# Patient Record
Sex: Female | Born: 1980 | Race: White | Hispanic: No | Smoking: Current every day smoker
Health system: Southern US, Community
[De-identification: ages and names within clinical notes are randomized; demographics above are authoritative.]

## PROBLEM LIST (undated history)

## (undated) ENCOUNTER — Inpatient Hospital Stay (HOSPITAL_COMMUNITY): Payer: Self-pay

## (undated) ENCOUNTER — Inpatient Hospital Stay (HOSPITAL_COMMUNITY): Payer: BC Managed Care – PPO

## (undated) DIAGNOSIS — F17200 Nicotine dependence, unspecified, uncomplicated: Secondary | ICD-10-CM

## (undated) DIAGNOSIS — R4586 Emotional lability: Secondary | ICD-10-CM

## (undated) DIAGNOSIS — O09299 Supervision of pregnancy with other poor reproductive or obstetric history, unspecified trimester: Secondary | ICD-10-CM

## (undated) DIAGNOSIS — Z789 Other specified health status: Secondary | ICD-10-CM

## (undated) DIAGNOSIS — F32A Depression, unspecified: Secondary | ICD-10-CM

## (undated) DIAGNOSIS — F329 Major depressive disorder, single episode, unspecified: Secondary | ICD-10-CM

## (undated) DIAGNOSIS — M545 Low back pain, unspecified: Secondary | ICD-10-CM

## (undated) DIAGNOSIS — Z8051 Family history of malignant neoplasm of kidney: Secondary | ICD-10-CM

## (undated) DIAGNOSIS — IMO0002 Reserved for concepts with insufficient information to code with codable children: Secondary | ICD-10-CM

## (undated) DIAGNOSIS — Z8041 Family history of malignant neoplasm of ovary: Secondary | ICD-10-CM

## (undated) HISTORY — PX: DILATION AND CURETTAGE OF UTERUS: SHX78

## (undated) HISTORY — DX: Supervision of pregnancy with other poor reproductive or obstetric history, unspecified trimester: O09.299

## (undated) HISTORY — DX: Low back pain, unspecified: M54.50

## (undated) HISTORY — DX: Low back pain: M54.5

## (undated) HISTORY — DX: Nicotine dependence, unspecified, uncomplicated: F17.200

## (undated) HISTORY — PX: WISDOM TOOTH EXTRACTION: SHX21

## (undated) HISTORY — DX: Family history of malignant neoplasm of ovary: Z80.41

## (undated) HISTORY — DX: Reserved for concepts with insufficient information to code with codable children: IMO0002

## (undated) HISTORY — DX: Family history of malignant neoplasm of kidney: Z80.51

## (undated) HISTORY — DX: Emotional lability: R45.86

---

## 2005-10-26 ENCOUNTER — Inpatient Hospital Stay (HOSPITAL_COMMUNITY): Admission: AD | Admit: 2005-10-26 | Discharge: 2005-10-28 | Payer: Self-pay | Admitting: Obstetrics and Gynecology

## 2010-10-28 LAB — HEPATITIS B SURFACE ANTIGEN: Hepatitis B Surface Ag: NEGATIVE

## 2010-10-28 LAB — ABO/RH: RH Type: POSITIVE

## 2010-10-28 LAB — ANTIBODY SCREEN: Antibody Screen: NEGATIVE

## 2011-02-17 NOTE — L&D Delivery Note (Signed)
Delivery Note At 3:00 PM a viable and healthy female was delivered via Vaginal, Spontaneous Delivery (Presentation:ROA ).  APGAR: 8,9 ; weight pending .   Placenta status: spontaneous and intact with 3 vessel  Cord:  with the following complications: none .  Cord pH: na  Anesthesia: Epidural  Episiotomy: None Lacerations: 1st degree Suture Repair: 2.0 vicryl rapide Est. Blood Loss (mL): 300  Mom to postpartum.  Baby to nursery-stable.  [name] J 05/19/2011, 3:11 PM

## 2011-03-05 LAB — RPR: RPR: NONREACTIVE

## 2011-03-24 ENCOUNTER — Encounter (HOSPITAL_COMMUNITY): Payer: Self-pay | Admitting: *Deleted

## 2011-03-24 ENCOUNTER — Observation Stay (HOSPITAL_COMMUNITY)
Admission: AD | Admit: 2011-03-24 | Discharge: 2011-03-25 | Disposition: A | Discharge status: Hospice | Payer: BC Managed Care – PPO | Source: Ambulatory Visit | Attending: Obstetrics and Gynecology | Admitting: Obstetrics and Gynecology

## 2011-03-24 DIAGNOSIS — O4290 Premature rupture of membranes, unspecified as to length of time between rupture and onset of labor, unspecified weeks of gestation: Principal | ICD-10-CM | POA: Insufficient documentation

## 2011-03-24 HISTORY — DX: Other specified health status: Z78.9

## 2011-03-24 HISTORY — DX: Depression, unspecified: F32.A

## 2011-03-24 HISTORY — DX: Major depressive disorder, single episode, unspecified: F32.9

## 2011-03-24 NOTE — Progress Notes (Signed)
Pt states she feels she had ROM at 2200. Pt states she was laying in bed.Pt states her back was hurting and removed heating pad and noticed that her underwear and sheets were wet

## 2011-03-25 ENCOUNTER — Inpatient Hospital Stay (HOSPITAL_COMMUNITY): Payer: BC Managed Care – PPO

## 2011-03-25 ENCOUNTER — Encounter (HOSPITAL_COMMUNITY): Payer: Self-pay | Admitting: Family Medicine

## 2011-03-25 NOTE — H&P (Signed)
[name], [name]               ACCOUNT NO.:  [id number]  MEDICAL RECORD NO.:  [id number]  LOCATION:  9176                          FACILITY:  [facility]  PHYSICIAN:  Lenoard Aden, M.D.DATE OF BIRTH:  August 31, 1980  DATE OF ADMISSION:  03/24/2011 DATE OF DISCHARGE:                             HISTORY & PHYSICAL   CHIEF COMPLAINT:  Questionable rupture of membranes.  HISTORY OF PRESENT ILLNESS:  A 31 year old white female, G5, P3 at 30 weeks and 2/7 days with questionable leakage of fluid this evening.  She has a history of 1st and 2nd trimester bleeding, which resolved and otherwise uncomplicated prenatal course.  She reports good fetal movement.  Denies contractions, had an episode of leakage of fluid this evening.  She has no known drug allergies.  MEDICATIONS:  Prenatal vitamins, Celexa, Wellbutrin, Vicodin as needed for pain.  FAMILY HISTORY:  Kidney cancer, heart disease.  PERSONAL HISTORY:  Noncontributory.  PREGNANCY HISTORY:  Remarkable for 3 vaginal deliveries and 2 spontaneous pregnancy loss.  Prenatal course complicated as noted above for bleeding of subchorionic hemorrhage lasting in the early 2nd trimester, subsequently resolved with normal followup ultrasound.  PHYSICAL EXAMINATION:  GENERAL:  A well-developed, well-nourished, white female, in no acute distress. HEENT:  Normal. NECK:  Supple.  Full range of motion. LUNGS:  Clear. HEART:  Regular rhythm. ABDOMEN:  Soft, gravid, nontender. VITAL SIGNS:  Blood pressure 117/59, pulse of 96. PELVIC:  Negative fern.  Negative Nitrazine.  Cervix is closed and long. EXTREMITIES:  There are no cords. NEUROLOGIC:  Nonfocal. SKIN:  Intact.  AFI initially done at 16.  Repeat AFI pending this morning.  IMPRESSION:  Thirty-week intrauterine pregnancy with questionable leakage of fluid, doubt rupture of membranes.  PLAN:  To repeat AFI.  Discharge to home, pending results.     Lenoard Aden,  M.D.     RJT/MEDQ  D:  03/25/2011  T:  03/25/2011  Job:  [id number]

## 2011-03-25 NOTE — Progress Notes (Signed)
[name] is a 31 y.o. [id number] at [redacted]w[redacted]d by LMP admitted for PROM ? Last night. No fluid leakage this am. Nl AFI.  Subjective: No further leakage  Objective: BP 117/59  Pulse 96  Temp(Src) 98.3 F (36.8 C) (Oral)  Resp 18  Ht 5\' 3"  (1.6 m)  Wt 90.266 kg (199 lb)  BMI 35.25 kg/m2      FHT:  FHR: 155 bpm, variability: moderate,  accelerations:  Present,  decelerations:  Absent UC:   none SVE:    closed/ long/ OOP No fluid , no pooling  Labs: Nl AFI on sono No results found for this basename: WBC, HGB, HCT, MCV, PLT    Assessment / Plan: 30 week IUP with r/o SROM. Nl AFI, no F/N on exam  Labor: na Preeclampsia:  na Fetal Wellbeing:  Category I Pain Control:  na I/D:  n/a Anticipated MOD:  DC home  Dariane Natzke J 03/25/2011, 8:57 AM

## 2011-03-25 NOTE — Progress Notes (Signed)
Pt returned to [facility].Pt would like an update of plan of care as soon as possible.

## 2011-03-25 NOTE — Progress Notes (Signed)
Dr. Billy Coast in to examine pt.  No cervical change.  D/C orders given to pt per Dr. Billy Coast and pt states understanding.  Pt. With no complaints.

## 2011-03-25 NOTE — Progress Notes (Signed)
Rosemae Mcquown is a 31 y.o. [id number] at [redacted]w[redacted]d by LMP admitted for PROM with ? LOF last night. Good FM.   Subjective: No complaints   Objective: BP 117/59  Pulse 96  Temp(Src) 98.3 F (36.8 C) (Oral)  Resp 18  Ht 5\' 3"  (1.6 m)  Wt 90.266 kg (199 lb)  BMI 35.25 kg/m2      FHT:  FHR: 140 bpm, variability: moderate,  accelerations:  Present,  decelerations:  Absent UC:   none SVE:    Neg fern and nitrazine  Labs: No results found for this basename: WBC, HGB, HCT, MCV, PLT  Sono c/w nl AFI 16  Assessment / Plan: ? PROM at 30 weeks  Labor: rpt AFI this am Preeclampsia:  na Fetal Wellbeing:  Category I Pain Control:  na I/D:  n/a Anticipated MOD:  na  Torres Hardenbrook J 03/25/2011, 7:58 AM

## 2011-03-27 NOTE — Progress Notes (Signed)
Post discharge chart review completed.  

## 2011-04-30 LAB — STREP B DNA PROBE: GBS: NEGATIVE

## 2011-05-08 ENCOUNTER — Telehealth (HOSPITAL_COMMUNITY): Payer: Self-pay | Admitting: *Deleted

## 2011-05-08 ENCOUNTER — Encounter (HOSPITAL_COMMUNITY): Payer: Self-pay | Admitting: *Deleted

## 2011-05-08 NOTE — Telephone Encounter (Signed)
Preadmission screen  

## 2011-05-14 ENCOUNTER — Other Ambulatory Visit: Payer: Self-pay | Admitting: Obstetrics and Gynecology

## 2011-05-19 ENCOUNTER — Inpatient Hospital Stay (HOSPITAL_COMMUNITY)
Admission: RE | Admit: 2011-05-19 | Discharge: 2011-05-20 | DRG: 372 | Disposition: A | Discharge status: Home / Self Care | Payer: BC Managed Care – PPO | Source: Ambulatory Visit | Attending: Obstetrics and Gynecology | Admitting: Obstetrics and Gynecology

## 2011-05-19 ENCOUNTER — Encounter (HOSPITAL_COMMUNITY): Payer: Self-pay

## 2011-05-19 ENCOUNTER — Encounter (HOSPITAL_COMMUNITY): Payer: Self-pay | Admitting: Anesthesiology

## 2011-05-19 ENCOUNTER — Inpatient Hospital Stay (HOSPITAL_COMMUNITY): Payer: BC Managed Care – PPO | Admitting: Anesthesiology

## 2011-05-19 DIAGNOSIS — O4590 Premature separation of placenta, unspecified, unspecified trimester: Principal | ICD-10-CM | POA: Diagnosis present

## 2011-05-19 LAB — CBC
HCT: 37.4 % (ref 36.0–46.0)
MCHC: 33.7 g/dL (ref 30.0–36.0)
Platelets: 185 10*3/uL (ref 150–400)
RDW: 14.2 % (ref 11.5–15.5)
WBC: 15.4 10*3/uL — ABNORMAL HIGH (ref 4.0–10.5)

## 2011-05-19 MED ORDER — LACTATED RINGERS IV SOLN
500.0000 mL | INTRAVENOUS | Status: DC | PRN
Start: 2011-05-19 — End: 2011-05-19

## 2011-05-19 MED ORDER — DIPHENHYDRAMINE HCL 25 MG PO CAPS: 25.0000 mg | ORAL_CAPSULE | Freq: Four times a day (QID) | ORAL | Status: DC | PRN

## 2011-05-19 MED ORDER — EPHEDRINE 5 MG/ML INJ
10.0000 mg | INTRAVENOUS | Status: DC | PRN
  Filled 2011-05-19 (×2): qty 4

## 2011-05-19 MED ORDER — OXYTOCIN 20 UNITS IN LACTATED RINGERS INFUSION - SIMPLE
1.0000 m[IU]/min | INTRAVENOUS | Status: DC
  Administered 2011-05-19: 2 m[IU]/min via INTRAVENOUS
  Filled 2011-05-19: qty 1000

## 2011-05-19 MED ORDER — FENTANYL 2.5 MCG/ML BUPIVACAINE 1/10 % EPIDURAL INFUSION (WH - ANES)
14.0000 mL/h | INTRAMUSCULAR | Status: DC
  Administered 2011-05-19 (×2): 14 mL/h via EPIDURAL
  Filled 2011-05-19 (×3): qty 60

## 2011-05-19 MED ORDER — OXYCODONE-ACETAMINOPHEN 5-325 MG PO TABS: 1.0000 | ORAL_TABLET | ORAL | Status: DC | PRN

## 2011-05-19 MED ORDER — FLEET ENEMA 7-19 GM/118ML RE ENEM: 1.0000 | ENEMA | RECTAL | Status: DC | PRN

## 2011-05-19 MED ORDER — BENZOCAINE-MENTHOL 20-0.5 % EX AERO
INHALATION_SPRAY | CUTANEOUS | Status: AC
  Administered 2011-05-19: 1 via TOPICAL
  Filled 2011-05-19: qty 56

## 2011-05-19 MED ORDER — SENNOSIDES-DOCUSATE SODIUM 8.6-50 MG PO TABS
2.0000 | ORAL_TABLET | Freq: Every day | ORAL | Status: DC
  Administered 2011-05-19: 2 via ORAL

## 2011-05-19 MED ORDER — METHYLERGONOVINE MALEATE 0.2 MG/ML IJ SOLN: 0.2000 mg | INTRAMUSCULAR | Status: DC | PRN

## 2011-05-19 MED ORDER — BENZOCAINE-MENTHOL 20-0.5 % EX AERO
1.0000 "application " | INHALATION_SPRAY | CUTANEOUS | Status: DC | PRN
  Administered 2011-05-19: 1 via TOPICAL

## 2011-05-19 MED ORDER — ONDANSETRON HCL 4 MG PO TABS: 4.0000 mg | ORAL_TABLET | ORAL | Status: DC | PRN

## 2011-05-19 MED ORDER — ACETAMINOPHEN 325 MG PO TABS: 650.0000 mg | ORAL_TABLET | ORAL | Status: DC | PRN

## 2011-05-19 MED ORDER — DIPHENHYDRAMINE HCL 50 MG/ML IJ SOLN: 12.5000 mg | INTRAMUSCULAR | Status: DC | PRN

## 2011-05-19 MED ORDER — METHYLERGONOVINE MALEATE 0.2 MG PO TABS: 0.2000 mg | ORAL_TABLET | ORAL | Status: DC | PRN

## 2011-05-19 MED ORDER — PHENYLEPHRINE 40 MCG/ML (10ML) SYRINGE FOR IV PUSH (FOR BLOOD PRESSURE SUPPORT)
80.0000 ug | PREFILLED_SYRINGE | INTRAVENOUS | Status: DC | PRN
  Filled 2011-05-19: qty 5

## 2011-05-19 MED ORDER — LANOLIN HYDROUS EX OINT: TOPICAL_OINTMENT | CUTANEOUS | Status: DC | PRN

## 2011-05-19 MED ORDER — DIBUCAINE 1 % RE OINT: 1.0000 "application " | TOPICAL_OINTMENT | RECTAL | Status: DC | PRN

## 2011-05-19 MED ORDER — WITCH HAZEL-GLYCERIN EX PADS: 1.0000 "application " | MEDICATED_PAD | CUTANEOUS | Status: DC | PRN

## 2011-05-19 MED ORDER — CITRIC ACID-SODIUM CITRATE 334-500 MG/5ML PO SOLN: 30.0000 mL | ORAL | Status: DC | PRN

## 2011-05-19 MED ORDER — LACTATED RINGERS IV SOLN: INTRAVENOUS | Status: DC

## 2011-05-19 MED ORDER — LIDOCAINE HCL (PF) 1 % IJ SOLN
INTRAMUSCULAR | Status: DC | PRN
  Administered 2011-05-19 (×2): 5 mL

## 2011-05-19 MED ORDER — SIMETHICONE 80 MG PO CHEW: 80.0000 mg | CHEWABLE_TABLET | ORAL | Status: DC | PRN

## 2011-05-19 MED ORDER — LACTATED RINGERS IV SOLN
500.0000 mL | Freq: Once | INTRAVENOUS | Status: AC
  Administered 2011-05-19: 1000 mL via INTRAVENOUS

## 2011-05-19 MED ORDER — TERBUTALINE SULFATE 1 MG/ML IJ SOLN: 0.2500 mg | Freq: Once | INTRAMUSCULAR | Status: DC | PRN

## 2011-05-19 MED ORDER — LIDOCAINE HCL (PF) 1 % IJ SOLN: 30.0000 mL | INTRAMUSCULAR | Status: DC | PRN

## 2011-05-19 MED ORDER — ONDANSETRON HCL 4 MG/2ML IJ SOLN: 4.0000 mg | INTRAMUSCULAR | Status: DC | PRN

## 2011-05-19 MED ORDER — EPHEDRINE 5 MG/ML INJ: 10.0000 mg | INTRAVENOUS | Status: DC | PRN

## 2011-05-19 MED ORDER — CITALOPRAM HYDROBROMIDE 20 MG PO TABS
20.0000 mg | ORAL_TABLET | Freq: Every day | ORAL | Status: DC
  Administered 2011-05-20: 20 mg via ORAL
  Filled 2011-05-19 (×3): qty 1

## 2011-05-19 MED ORDER — OXYTOCIN 20 UNITS IN LACTATED RINGERS INFUSION - SIMPLE: 125.0000 mL/h | INTRAVENOUS | Status: DC

## 2011-05-19 MED ORDER — PRENATAL MULTIVITAMIN CH
1.0000 | ORAL_TABLET | Freq: Every day | ORAL | Status: DC
  Administered 2011-05-19 – 2011-05-20 (×2): 1 via ORAL
  Filled 2011-05-19 (×2): qty 1

## 2011-05-19 MED ORDER — OXYTOCIN BOLUS FROM INFUSION
500.0000 mL | Freq: Once | INTRAVENOUS | Status: DC
  Filled 2011-05-19: qty 500

## 2011-05-19 MED ORDER — ZOLPIDEM TARTRATE 5 MG PO TABS: 5.0000 mg | ORAL_TABLET | Freq: Every evening | ORAL | Status: DC | PRN

## 2011-05-19 MED ORDER — IBUPROFEN 600 MG PO TABS
600.0000 mg | ORAL_TABLET | Freq: Four times a day (QID) | ORAL | Status: DC
  Administered 2011-05-19 – 2011-05-20 (×4): 600 mg via ORAL
  Filled 2011-05-19 (×3): qty 1

## 2011-05-19 MED ORDER — TETANUS-DIPHTH-ACELL PERTUSSIS 5-2.5-18.5 LF-MCG/0.5 IM SUSP
0.5000 mL | Freq: Once | INTRAMUSCULAR | Status: AC
  Administered 2011-05-20: 0.5 mL via INTRAMUSCULAR
  Filled 2011-05-19: qty 0.5

## 2011-05-19 MED ORDER — OXYTOCIN 20 UNITS IN LACTATED RINGERS INFUSION - SIMPLE: 125.0000 mL/h | Freq: Once | INTRAVENOUS | Status: DC

## 2011-05-19 MED ORDER — ONDANSETRON HCL 4 MG/2ML IJ SOLN: 4.0000 mg | Freq: Four times a day (QID) | INTRAMUSCULAR | Status: DC | PRN

## 2011-05-19 MED ORDER — IBUPROFEN 600 MG PO TABS: 600.0000 mg | ORAL_TABLET | Freq: Four times a day (QID) | ORAL | Status: DC | PRN

## 2011-05-19 NOTE — Progress Notes (Signed)
[name] is a 31 y.o. [id number] at [redacted]w[redacted]d by ultrasound admitted for induction of labor due to unexplained third trimester bleeding.  Subjective: comfortable   Objective: BP 119/71  Pulse 86  Temp(Src) 98.2 F (36.8 C) (Oral)  Resp 18  Ht 5\' 3"  (1.6 m)  Wt 90.719 kg (200 lb)  BMI 35.43 kg/m2  SpO2 99%      FHT:  FHR: 155 bpm, variability: moderate,  accelerations:  Present,  decelerations:  Absent UC:   regular, every 2-3 minutes SVE:   Dilation: 5 Effacement (%): 80 Station: -1 Exam by:: Deshawnda Acrey IUPC placed without difficulty  Labs: Lab Results  Component Value Date   WBC 15.4* 05/19/2011   HGB 12.6 05/19/2011   HCT 37.4 05/19/2011   MCV 92.8 05/19/2011   PLT 185 05/19/2011    Assessment / Plan: Induction of labor due to third trimester bleeding suspicious for chronic abruptio,  progressing well on pitocin  Labor: Progressing normally Preeclampsia:  na Fetal Wellbeing:  Category I Pain Control:  Epidural I/D:  n/a Anticipated MOD:  NSVD  Tasean Mancha J 05/19/2011, 1:59 PM

## 2011-05-19 NOTE — Anesthesia Preprocedure Evaluation (Signed)
Anesthesia Evaluation  Patient identified by MRN, date of birth, ID band Patient awake    Reviewed: Allergy & Precautions, H&P , Patient's Chart, lab work & pertinent test results  Airway Mallampati: II TM Distance: >3 FB Neck ROM: full    Dental No notable dental hx.    Pulmonary neg pulmonary ROS, Current Smoker,  breath sounds clear to auscultation  Pulmonary exam normal       Cardiovascular negative cardio ROS  Rhythm:regular Rate:Normal     Neuro/Psych negative neurological ROS  negative psych ROS   GI/Hepatic negative GI ROS, Neg liver ROS,   Endo/Other  negative endocrine ROS  Renal/GU negative Renal ROS     Musculoskeletal   Abdominal   Peds  Hematology negative hematology ROS (+)   Anesthesia Other Findings   Reproductive/Obstetrics (+) Pregnancy                           Anesthesia Physical Anesthesia Plan  ASA: II  Anesthesia Plan: Epidural   Post-op Pain Management:    Induction:   Airway Management Planned:   Additional Equipment:   Intra-op Plan:   Post-operative Plan:   Informed Consent: I have reviewed the patients History and Physical, chart, labs and discussed the procedure including the risks, benefits and alternatives for the proposed anesthesia with the patient or authorized representative who has indicated his/her understanding and acceptance.     Plan Discussed with:   Anesthesia Plan Comments:         Anesthesia Quick Evaluation

## 2011-05-19 NOTE — H&P (Signed)
[name], [name]               ACCOUNT NO.:  [id number]  MEDICAL RECORD NO.:  [id number]  LOCATION:  9170                          FACILITY:  [facility]  PHYSICIAN:  Lenoard Aden, M.D.DATE OF BIRTH:  05/11/1980  DATE OF ADMISSION:  05/19/2011 DATE OF DISCHARGE:                             HISTORY & PHYSICAL   CHIEF COMPLAINT:  History of unexplained third trimester bleeding, for induction at term.  HISTORY OF PRESENT ILLNESS:  She is a 31 year old white female, G5, P3, at 71 weeks' gestation, who presents for the aforementioned indications for induction.  She has a medication list to include Wellbutrin, Celexa, prenatal vitamins.  FAMILY HISTORY:  Cancer, heart disease, and kidney stones.  She has a history of 3 vaginal deliveries and 1 pregnancy loss.  She is a nonsmoker, nondrinker.  She denies domestic or physical violence.  MEDICAL HISTORY:  Remarkable for depression and anxiety, controlled on Celexa and Wellbutrin.  Her prenatal course has been complicated by unexplained bleeding in the second trimester which started at approximately 13-16 weeks with intermittent bleeding noted through the early third trimester.  No evidence of placenta previa.  No evidence of subchorionic hemorrhage in the third trimester, however, multiple episodes were noted suspicious for chronic abruption.  This case was discussed with Dr. Particia Nearing, MFM, who agreed with induction of labor at 39 weeks due to history of unexplained bleeding.  PHYSICAL EXAMINATION:  GENERAL:  She is a well-developed, well-nourished white female, in no acute distress. HEENT:  Normal. NECK:  Supple.  Full range of motion. LUNGS:  Clear. HEART:  Regular rhythm. ABDOMEN:  Soft, gravid, nontender. OB/GYN:  Estimated fetal weight 7 pounds.  Cervix 2-3 cm, 60%, vertex, - 1. EXTREMITIES:  Reveal no cords. NEUROLOGIC:  Nonfocal. SKIN:  Intact.  IMPRESSION:  A 39-week intrauterine pregnancy with a history  of unexplained third trimester bleeding suspicious for chronic abruption.  PLAN:  Will be to admit, administer Pitocin, epidural as needed, anticipate attempts at vaginal delivery.     Lenoard Aden, M.D.     RJT/MEDQ  D:  05/19/2011  T:  05/19/2011  Job:  [id number]

## 2011-05-19 NOTE — Progress Notes (Signed)
[name] is a 31 y.o. [id number] at [redacted]w[redacted]d by ultrasound admitted for induction of labor due to Unexplained third trimester bleeding.  Subjective: Comfortable No bleeding today  Objective: VSS- afebrile      FHT:  FHR: 155 bpm, variability: moderate,  accelerations:  Present,  decelerations:  Absent UC:   rare SVE:    2/60/-1  Labs: No results found for this basename: WBC, HGB, HCT, MCV, PLT    Assessment / Plan: Induction of labor due to unexplained third trimester bleeding, ? chronic abruptio,  progressing well on pitocin  Labor: Progressing on Pitocin, will continue to increase then AROM Preeclampsia:  na Fetal Wellbeing:  Category I Pain Control:  Labor support without medications I/D:  n/a Anticipated MOD:  NSVD  Prem Coykendall J 05/19/2011, 7:49 AM

## 2011-05-19 NOTE — Anesthesia Procedure Notes (Signed)
Epidural Patient location during procedure: OB Start time: 05/19/2011 9:28 AM  Staffing Anesthesiologist: Brayton Caves R Performed by: anesthesiologist   Preanesthetic Checklist Completed: patient identified, site marked, surgical consent, pre-op evaluation, timeout performed, IV checked, risks and benefits discussed and monitors and equipment checked  Epidural Patient position: sitting Prep: site prepped and draped and DuraPrep Patient monitoring: continuous pulse ox and blood pressure Approach: midline Injection technique: LOR air and LOR saline  Needle:  Needle type: Tuohy  Needle gauge: 17 G Needle length: 9 cm Needle insertion depth: 6 cm Catheter type: closed end flexible Catheter size: 19 Gauge Catheter at skin depth: 11 cm Test dose: negative  Assessment Events: blood not aspirated, injection not painful, no injection resistance, negative IV test and no paresthesia  Additional Notes Patient identified.  Risk benefits discussed including failed block, incomplete pain control, headache, nerve damage, paralysis, blood pressure changes, nausea, vomiting, reactions to medication both toxic or allergic, and postpartum back pain.  Patient expressed understanding and wished to proceed.  All questions were answered.  Sterile technique used throughout procedure and epidural site dressed with sterile barrier dressing. No paresthesia or other complications noted.The patient did not experience any signs of intravascular injection such as tinnitus or metallic taste in mouth nor signs of intrathecal spread such as rapid motor block. Please see nursing notes for vital signs.

## 2011-05-20 LAB — CBC
MCHC: 33.2 g/dL (ref 30.0–36.0)
Platelets: 172 10*3/uL (ref 150–400)
RDW: 14.2 % (ref 11.5–15.5)
WBC: 15.1 10*3/uL — ABNORMAL HIGH (ref 4.0–10.5)

## 2011-05-20 MED ORDER — IBUPROFEN 600 MG PO TABS: 600.0000 mg | ORAL_TABLET | Freq: Four times a day (QID) | ORAL | Status: AC

## 2011-05-20 NOTE — Anesthesia Postprocedure Evaluation (Signed)
  Anesthesia Post-op Note  Patient: [name]  Procedure(s) Performed: * No procedures listed *  Patient Location: Mother/Baby  Anesthesia Type: Epidural  Level of Consciousness: oriented  Airway and Oxygen Therapy: Patient Spontanous Breathing  Post-op Pain: mild  Post-op Assessment: Patient's Cardiovascular Status Stable and Respiratory Function Stable  Post-op Vital Signs: stable  Complications: No apparent anesthesia complications

## 2011-05-20 NOTE — Discharge Summary (Signed)
Obstetric Discharge Summary Reason for Admission: induction of labor secondary to unexplained third trimester bleeding Prenatal Procedures: ultrasound Intrapartum Procedures: spontaneous vaginal delivery Postpartum Procedures: none Complications-Operative and Postpartum: 2nd degree perineal laceration Hemoglobin  Date Value Range Status  05/20/2011 12.3  12.0-15.0 (g/dL) Final     HCT  Date Value Range Status  05/20/2011 37.1  36.0-46.0 (%) Final    Physical Exam:  General: alert, cooperative and no distress Lochia: appropriate Uterine Fundus: firm Incision: healing well DVT Evaluation: No evidence of DVT seen on physical exam.  Discharge Diagnoses: Term Pregnancy-delivered  Discharge Information: Date: 05/20/2011 Activity: pelvic rest Diet: routine Medications: Ibuprofen 600po q6hrs prn pain Condition: stable Instructions: refer to practice specific booklet Discharge to: home   Newborn Data: Live born female  Birth Weight: 6 lb 7 oz (2920 g) APGAR: 9, 10  Home with mother.  [name] 05/20/2011, 9:27 AM

## 2011-05-20 NOTE — Progress Notes (Signed)

## 2011-05-20 NOTE — Progress Notes (Signed)
Patient ID: [name], female   DOB: 1980/06/13, 31 y.o.   MRN: [id number] PPD # 2  Subjective: Pt reports feeling well/ Pain controlled with prescription NSAID's including ibuprofen Tolerating po/ Voiding without problems/ No n/v/no stool Bleeding is light/ Newborn info:  Information for the patient's newborn:  Valeria, Boza [[id number]]  female Feeding: breast    Objective:  VS: Blood pressure 106/72, pulse 77, temperature 98.2 F (36.8 C), temperature source Oral, resp. rate 20, height 5\' 3"  (1.6 m), weight 90.719 kg (200 lb), SpO2 98.00%, unknown if currently breastfeeding.    Basename 05/20/11 0535 05/19/11 0800  WBC 15.1* 15.4*  HGB 12.3 12.6  HCT 37.1 37.4  PLT 172 185    Blood type: A/Positive/-- (09/11 0000) Rubella: Immune (09/11 0000)    Physical Exam:  General: A & O x 3  alert, cooperative and no distress CV: Regular rate and rhythm Resp: clear Abdomen: soft, nontender, normal bowel sounds Uterine Fundus: firm, below umbilicus, nontender Perineum: no evidence of infection or separation Lochia: minimal Ext: extremities normal, atraumatic, no cyanosis or edema    A/P: PPD # 2/ G5P4014/ S/P:spontaneous vaginal delivery 05/19/10 Doing well and stable for discharge home RX: Ibuprofen 600mg  po Q 6 hrs prn pain #30 Refill x 1 WOB/GYN booklet given Routine pp visit in 6wks   [facility], [facility] 05/20/2011, 9:17 AM

## 2012-02-17 NOTE — L&D Delivery Note (Signed)
Delivery Note At 10:14 PM a viable and healthy female was delivered via Vaginal, Spontaneous Delivery (Presentation: left occiput; anterior ).  APGAR: 9, 9; weight pending.   Placenta status: Intact, Spontaneous.  Cord: 3 vessels   Anesthesia: Epidural  Episiotomy: None Lacerations: 1st degree Suture Repair: 3.0 vicryl Est. Blood Loss (mL):   Mom to postpartum.  Baby to nursery-stable.  [name] 11/04/2012, 10:34 PM

## 2012-08-02 LAB — OB RESULTS CONSOLE ANTIBODY SCREEN: Antibody Screen: NEGATIVE

## 2012-08-02 LAB — OB RESULTS CONSOLE GC/CHLAMYDIA
Chlamydia: NEGATIVE
Gonorrhea: NEGATIVE

## 2012-08-02 LAB — OB RESULTS CONSOLE ABO/RH

## 2012-10-06 LAB — OB RESULTS CONSOLE GBS: GBS: NEGATIVE

## 2012-10-28 ENCOUNTER — Encounter (HOSPITAL_COMMUNITY): Payer: Self-pay | Admitting: *Deleted

## 2012-10-28 ENCOUNTER — Inpatient Hospital Stay (HOSPITAL_COMMUNITY)
Admission: AD | Admit: 2012-10-28 | Discharge: 2012-10-28 | Disposition: A | Discharge status: Home / Self Care | Payer: Medicaid Other | Source: Ambulatory Visit | Attending: Obstetrics and Gynecology | Admitting: Obstetrics and Gynecology

## 2012-10-28 DIAGNOSIS — O479 False labor, unspecified: Secondary | ICD-10-CM | POA: Insufficient documentation

## 2012-10-28 NOTE — MAU Note (Signed)
I was checked at office today and was 4cm and thin. My contractions are closer and stronger since the exam at office

## 2012-10-28 NOTE — MAU Note (Signed)
Patient presents to MAU today with c/o contractions. Denies LOF nor bleeding. Reports good fetal movement.

## 2012-11-04 ENCOUNTER — Encounter (HOSPITAL_COMMUNITY): Payer: Self-pay | Admitting: *Deleted

## 2012-11-04 ENCOUNTER — Inpatient Hospital Stay (HOSPITAL_COMMUNITY)
Admission: AD | Admit: 2012-11-04 | Discharge: 2012-11-06 | DRG: 775 | Disposition: A | Discharge status: Home / Self Care | Payer: Medicaid Other | Source: Ambulatory Visit | Attending: Obstetrics and Gynecology | Admitting: Obstetrics and Gynecology

## 2012-11-04 ENCOUNTER — Encounter (HOSPITAL_COMMUNITY): Payer: Self-pay | Admitting: Anesthesiology

## 2012-11-04 ENCOUNTER — Inpatient Hospital Stay (HOSPITAL_COMMUNITY): Payer: Medicaid Other | Admitting: Anesthesiology

## 2012-11-04 DIAGNOSIS — O480 Post-term pregnancy: Principal | ICD-10-CM | POA: Diagnosis present

## 2012-11-04 DIAGNOSIS — O09529 Supervision of elderly multigravida, unspecified trimester: Secondary | ICD-10-CM | POA: Diagnosis present

## 2012-11-04 DIAGNOSIS — O99334 Smoking (tobacco) complicating childbirth: Secondary | ICD-10-CM | POA: Diagnosis present

## 2012-11-04 LAB — CBC
HCT: 38.2 % (ref 36.0–46.0)
Hemoglobin: 13.3 g/dL (ref 12.0–15.0)
MCH: 31.7 pg (ref 26.0–34.0)
MCV: 91 fL (ref 78.0–100.0)
RBC: 4.2 MIL/uL (ref 3.87–5.11)

## 2012-11-04 LAB — TYPE AND SCREEN

## 2012-11-04 LAB — RPR: RPR Ser Ql: NONREACTIVE

## 2012-11-04 MED ORDER — OXYTOCIN 40 UNITS IN LACTATED RINGERS INFUSION - SIMPLE MED
62.5000 mL/h | INTRAVENOUS | Status: DC
  Filled 2012-11-04: qty 1000

## 2012-11-04 MED ORDER — LACTATED RINGERS IV SOLN
INTRAVENOUS | Status: DC
  Administered 2012-11-04 (×2): via INTRAVENOUS

## 2012-11-04 MED ORDER — OXYTOCIN 40 UNITS IN LACTATED RINGERS INFUSION - SIMPLE MED
1.0000 m[IU]/min | INTRAVENOUS | Status: DC
  Administered 2012-11-04: 2 m[IU]/min via INTRAVENOUS

## 2012-11-04 MED ORDER — DIPHENHYDRAMINE HCL 50 MG/ML IJ SOLN: 12.5000 mg | INTRAMUSCULAR | Status: DC | PRN

## 2012-11-04 MED ORDER — EPHEDRINE 5 MG/ML INJ
10.0000 mg | INTRAVENOUS | Status: DC | PRN
  Filled 2012-11-04: qty 4
  Filled 2012-11-04: qty 2

## 2012-11-04 MED ORDER — ACETAMINOPHEN 325 MG PO TABS: 650.0000 mg | ORAL_TABLET | ORAL | Status: DC | PRN

## 2012-11-04 MED ORDER — OXYTOCIN BOLUS FROM INFUSION
500.0000 mL | INTRAVENOUS | Status: DC
  Administered 2012-11-04: 500 mL via INTRAVENOUS

## 2012-11-04 MED ORDER — ONDANSETRON HCL 4 MG/2ML IJ SOLN: 4.0000 mg | Freq: Four times a day (QID) | INTRAMUSCULAR | Status: DC | PRN

## 2012-11-04 MED ORDER — IBUPROFEN 600 MG PO TABS
600.0000 mg | ORAL_TABLET | Freq: Four times a day (QID) | ORAL | Status: DC | PRN
  Filled 2012-11-04: qty 1

## 2012-11-04 MED ORDER — TERBUTALINE SULFATE 1 MG/ML IJ SOLN: 0.2500 mg | Freq: Once | INTRAMUSCULAR | Status: AC | PRN

## 2012-11-04 MED ORDER — LIDOCAINE HCL (PF) 1 % IJ SOLN
30.0000 mL | INTRAMUSCULAR | Status: DC | PRN
  Filled 2012-11-04 (×2): qty 30

## 2012-11-04 MED ORDER — LACTATED RINGERS IV SOLN: 500.0000 mL | Freq: Once | INTRAVENOUS | Status: DC

## 2012-11-04 MED ORDER — FENTANYL 2.5 MCG/ML BUPIVACAINE 1/10 % EPIDURAL INFUSION (WH - ANES)
14.0000 mL/h | INTRAMUSCULAR | Status: DC | PRN
  Administered 2012-11-04: 14 mL/h via EPIDURAL
  Filled 2012-11-04: qty 125

## 2012-11-04 MED ORDER — OXYCODONE-ACETAMINOPHEN 5-325 MG PO TABS: 1.0000 | ORAL_TABLET | ORAL | Status: DC | PRN

## 2012-11-04 MED ORDER — ZOLPIDEM TARTRATE 5 MG PO TABS: 5.0000 mg | ORAL_TABLET | Freq: Every evening | ORAL | Status: DC | PRN

## 2012-11-04 MED ORDER — PHENYLEPHRINE 40 MCG/ML (10ML) SYRINGE FOR IV PUSH (FOR BLOOD PRESSURE SUPPORT)
80.0000 ug | PREFILLED_SYRINGE | INTRAVENOUS | Status: DC | PRN
  Filled 2012-11-04: qty 2

## 2012-11-04 MED ORDER — CITRIC ACID-SODIUM CITRATE 334-500 MG/5ML PO SOLN: 30.0000 mL | ORAL | Status: DC | PRN

## 2012-11-04 MED ORDER — PHENYLEPHRINE 40 MCG/ML (10ML) SYRINGE FOR IV PUSH (FOR BLOOD PRESSURE SUPPORT)
80.0000 ug | PREFILLED_SYRINGE | INTRAVENOUS | Status: DC | PRN
  Filled 2012-11-04: qty 2
  Filled 2012-11-04: qty 5

## 2012-11-04 MED ORDER — BUTORPHANOL TARTRATE 1 MG/ML IJ SOLN: 1.0000 mg | INTRAMUSCULAR | Status: DC | PRN

## 2012-11-04 MED ORDER — SODIUM BICARBONATE 8.4 % IV SOLN
INTRAVENOUS | Status: DC | PRN
  Administered 2012-11-04: 5 mL via EPIDURAL

## 2012-11-04 MED ORDER — LACTATED RINGERS IV SOLN: 500.0000 mL | INTRAVENOUS | Status: DC | PRN

## 2012-11-04 MED ORDER — EPHEDRINE 5 MG/ML INJ
10.0000 mg | INTRAVENOUS | Status: DC | PRN
  Filled 2012-11-04: qty 2

## 2012-11-04 NOTE — Anesthesia Preprocedure Evaluation (Addendum)

## 2012-11-04 NOTE — H&P (Addendum)
Pascha Fogal is a 32 y.o. female presenting for IOL for postterm pregnancy  32 G6P4014 @ 40+3 presenting for PD IOL. Pts pregnancy has been uncomplicated History OB History   Grav Para Term Preterm Abortions TAB SAB Ect Mult Living   6 4 4  0 1 0 1 0 0 4     Past Medical History  Diagnosis Date  . No pertinent past medical history   . Depression   . Pregnancy with other poor obstetric history(V23.49)   . Tobacco use disorder   . Emotional lability   . Lumbago   . Abnormal Pap smear    Past Surgical History  Procedure Laterality Date  . Dilation and curettage of uterus     Family History: family history includes Cancer in her paternal grandmother; Heart attack in her father; Heart disease in her father; Hyperlipidemia in her mother; Hypertension in her mother; Kidney disease in her father. Social History:  reports that she has been smoking Cigarettes.  She has been smoking about 0.50 packs per day. She has never used smokeless tobacco. She reports that she does not drink alcohol or use illicit drugs.   Prenatal Transfer Tool  Maternal Diabetes: No Genetic Screening: Normal Maternal Ultrasounds/Referrals: Normal Fetal Ultrasounds or other Referrals:  None Maternal Substance Abuse:  No Significant Maternal Medications:  None Significant Maternal Lab Results:  None Other Comments:  None  ROS: as above  Dilation: 5.5 Effacement (%): 80 Station: -2;-1 Exam by:: Dr. Tenny Craw Blood pressure 112/69, pulse 98, temperature 97.8 F (36.6 C), temperature source Axillary, resp. rate 20, height 5\' 3"  (1.6 m), weight 94.348 kg (208 lb), SpO2 99.00%, unknown if currently breastfeeding. Exam Physical Exam  Prenatal labs: ABO, Rh: --/--/A POS (09/19 1530) Antibody: NEG (09/19 1530) Rubella: Immune (06/17 0000) RPR: Nonreactive (06/17 0000)  HBsAg: Negative (06/17 0000)  HIV: Non-reactive (06/17 0000)  GBS: Negative (08/21 0000)   Assessment/Plan: 1) Admit 2) Epidural 3) AROM clear  fluid 4) Start pitocin if needed    Erdine Hulen H. 11/04/2012, 5:41 PM

## 2012-11-04 NOTE — Anesthesia Procedure Notes (Signed)

## 2012-11-05 LAB — CBC
HCT: 34.6 % — ABNORMAL LOW (ref 36.0–46.0)
Hemoglobin: 12.1 g/dL (ref 12.0–15.0)
WBC: 17.2 10*3/uL — ABNORMAL HIGH (ref 4.0–10.5)

## 2012-11-05 MED ORDER — PRENATAL MULTIVITAMIN CH
1.0000 | ORAL_TABLET | Freq: Every day | ORAL | Status: DC
  Administered 2012-11-05: 1 via ORAL
  Filled 2012-11-05: qty 1

## 2012-11-05 MED ORDER — DIPHENHYDRAMINE HCL 25 MG PO CAPS: 25.0000 mg | ORAL_CAPSULE | Freq: Four times a day (QID) | ORAL | Status: DC | PRN

## 2012-11-05 MED ORDER — ONDANSETRON HCL 4 MG PO TABS: 4.0000 mg | ORAL_TABLET | ORAL | Status: DC | PRN

## 2012-11-05 MED ORDER — WITCH HAZEL-GLYCERIN EX PADS: 1.0000 "application " | MEDICATED_PAD | CUTANEOUS | Status: DC | PRN

## 2012-11-05 MED ORDER — TETANUS-DIPHTH-ACELL PERTUSSIS 5-2.5-18.5 LF-MCG/0.5 IM SUSP
0.5000 mL | Freq: Once | INTRAMUSCULAR | Status: AC
  Administered 2012-11-05: 0.5 mL via INTRAMUSCULAR
  Filled 2012-11-05: qty 0.5

## 2012-11-05 MED ORDER — BENZOCAINE-MENTHOL 20-0.5 % EX AERO
1.0000 "application " | INHALATION_SPRAY | CUTANEOUS | Status: DC | PRN
  Administered 2012-11-05: 1 via TOPICAL
  Filled 2012-11-05: qty 56

## 2012-11-05 MED ORDER — METHYLERGONOVINE MALEATE 0.2 MG/ML IJ SOLN: 0.2000 mg | INTRAMUSCULAR | Status: DC | PRN

## 2012-11-05 MED ORDER — INFLUENZA VAC SPLIT QUAD 0.5 ML IM SUSP
0.5000 mL | INTRAMUSCULAR | Status: AC
  Administered 2012-11-05: 0.5 mL via INTRAMUSCULAR
  Filled 2012-11-05: qty 0.5

## 2012-11-05 MED ORDER — LANOLIN HYDROUS EX OINT: TOPICAL_OINTMENT | CUTANEOUS | Status: DC | PRN

## 2012-11-05 MED ORDER — DIBUCAINE 1 % RE OINT: 1.0000 "application " | TOPICAL_OINTMENT | RECTAL | Status: DC | PRN

## 2012-11-05 MED ORDER — ZOLPIDEM TARTRATE 5 MG PO TABS: 5.0000 mg | ORAL_TABLET | Freq: Every evening | ORAL | Status: DC | PRN

## 2012-11-05 MED ORDER — ONDANSETRON HCL 4 MG/2ML IJ SOLN: 4.0000 mg | INTRAMUSCULAR | Status: DC | PRN

## 2012-11-05 MED ORDER — PNEUMOCOCCAL VAC POLYVALENT 25 MCG/0.5ML IJ INJ
0.5000 mL | INJECTION | INTRAMUSCULAR | Status: AC
  Administered 2012-11-06: 0.5 mL via INTRAMUSCULAR
  Filled 2012-11-05: qty 0.5

## 2012-11-05 MED ORDER — METHYLERGONOVINE MALEATE 0.2 MG PO TABS: 0.2000 mg | ORAL_TABLET | ORAL | Status: DC | PRN

## 2012-11-05 MED ORDER — OXYCODONE-ACETAMINOPHEN 5-325 MG PO TABS
1.0000 | ORAL_TABLET | ORAL | Status: DC | PRN
  Administered 2012-11-05: 1 via ORAL
  Filled 2012-11-05: qty 1

## 2012-11-05 MED ORDER — IBUPROFEN 600 MG PO TABS
600.0000 mg | ORAL_TABLET | Freq: Four times a day (QID) | ORAL | Status: DC
  Administered 2012-11-05 – 2012-11-06 (×6): 600 mg via ORAL
  Filled 2012-11-05 (×5): qty 1

## 2012-11-05 MED ORDER — SIMETHICONE 80 MG PO CHEW: 80.0000 mg | CHEWABLE_TABLET | ORAL | Status: DC | PRN

## 2012-11-05 MED ORDER — SENNOSIDES-DOCUSATE SODIUM 8.6-50 MG PO TABS
2.0000 | ORAL_TABLET | ORAL | Status: DC
  Administered 2012-11-05: 2 via ORAL

## 2012-11-05 NOTE — Anesthesia Postprocedure Evaluation (Signed)
Anesthesia Post Note  Patient: [name]  Procedure(s) Performed: * No procedures listed *  Anesthesia type: Epidural  Patient location: Mother/Baby  Post pain: Pain level controlled  Post assessment: Post-op Vital signs reviewed  Last Vitals:  Filed Vitals:   11/05/12 0505  BP: 109/66  Pulse: 97  Temp: 36.8 C  Resp: 16    Post vital signs: Reviewed  Level of consciousness:alert  Complications: No apparent anesthesia complications

## 2012-11-05 NOTE — Anesthesia Postprocedure Evaluation (Signed)
Anesthesia Post Note  Patient: [name]  Procedure(s) Performed: * No procedures listed *  Anesthesia type: Epidural  Patient location: Mother/Baby  Post pain: Pain level controlled  Post assessment: Post-op Vital signs reviewed  Last Vitals:  Filed Vitals:   11/05/12 0505  BP: 109/66  Pulse: 97  Temp: 36.8 C  Resp: 16    Post vital signs: Reviewed  Level of consciousness:alert  Complications: No apparent anesthesia complications 

## 2012-11-05 NOTE — Progress Notes (Signed)
Post Partum Day 1 Subjective: no complaints, up ad lib, voiding and tolerating PO  Objective: Blood pressure 109/66, pulse 97, temperature 98.3 F (36.8 C), temperature source Oral, resp. rate 16, height 5\' 3"  (1.6 m), weight 94.348 kg (208 lb), SpO2 99.00%, unknown if currently breastfeeding.  Physical Exam:  General: alert, cooperative and appears stated age Lochia: appropriate Uterine Fundus: firm   Recent Labs  11/04/12 1530 11/05/12 0630  HGB 13.3 12.1  HCT 38.2 34.6*    Assessment/Plan: Plan for discharge tomorrow   LOS: 1 day   Jasmine Cavell H. 11/05/2012, 12:06 PM

## 2012-11-06 MED ORDER — OXYCODONE-ACETAMINOPHEN 5-325 MG PO TABS: 2.0000 | ORAL_TABLET | ORAL | Status: DC | PRN

## 2012-11-06 MED ORDER — DOCUSATE SODIUM 100 MG PO CAPS: 100.0000 mg | ORAL_CAPSULE | Freq: Two times a day (BID) | ORAL | Status: DC

## 2012-11-06 MED ORDER — IBUPROFEN 600 MG PO TABS: 600.0000 mg | ORAL_TABLET | Freq: Four times a day (QID) | ORAL | Status: DC | PRN

## 2012-11-06 NOTE — Discharge Summary (Signed)
Obstetric Discharge Summary Reason for Admission: induction of labor Prenatal Procedures: ultrasound Intrapartum Procedures: spontaneous vaginal delivery Postpartum Procedures: none Complications-Operative and Postpartum: 1st degree perineal laceration Hemoglobin  Date Value Range Status  11/05/2012 12.1  12.0 - 15.0 g/dL Final     HCT  Date Value Range Status  11/05/2012 34.6* 36.0 - 46.0 % Final    Physical Exam:  General: alert, cooperative and appears stated age Lochia: appropriate Uterine Fundus: firm  Discharge Diagnoses: Term Pregnancy-delivered  Discharge Information: Date: 11/06/2012 Activity: pelvic rest Diet: routine Medications: Ibuprofen, Colace and Percocet Condition: improved Instructions: refer to practice specific booklet Discharge to: home Follow-up Information   Follow up with Almon Hercules., MD In 4 weeks. (For a PP evaluation)    Specialty:  Obstetrics and Gynecology   Contact information:   [address] [place] [address] [phone]       Newborn Data: Live born female  Birth Weight: 8 lb 1.6 oz (3675 g) APGAR: 9, 9  Home with mother.  Tannon Peerson H. 11/06/2012, 9:55 AM

## 2013-12-18 ENCOUNTER — Encounter (HOSPITAL_COMMUNITY): Payer: Self-pay | Admitting: *Deleted

## 2015-01-31 ENCOUNTER — Ambulatory Visit (INDEPENDENT_AMBULATORY_CARE_PROVIDER_SITE_OTHER): Payer: PRIVATE HEALTH INSURANCE | Admitting: Obstetrics & Gynecology

## 2015-01-31 ENCOUNTER — Encounter: Payer: Self-pay | Admitting: Obstetrics & Gynecology

## 2015-01-31 ENCOUNTER — Other Ambulatory Visit (HOSPITAL_COMMUNITY)
Admission: RE | Admit: 2015-01-31 | Discharge: 2015-01-31 | Disposition: A | Discharge status: Home / Self Care | Payer: PRIVATE HEALTH INSURANCE | Source: Ambulatory Visit | Attending: Obstetrics & Gynecology | Admitting: Obstetrics & Gynecology

## 2015-01-31 VITALS — BP 120/60 | HR 80 | Wt 182.0 lb

## 2015-01-31 DIAGNOSIS — Z01419 Encounter for gynecological examination (general) (routine) without abnormal findings: Secondary | ICD-10-CM

## 2015-01-31 DIAGNOSIS — Z8041 Family history of malignant neoplasm of ovary: Secondary | ICD-10-CM

## 2015-01-31 DIAGNOSIS — Z1151 Encounter for screening for human papillomavirus (HPV): Secondary | ICD-10-CM | POA: Diagnosis not present

## 2015-01-31 NOTE — Progress Notes (Signed)
Patient ID: [name], female   DOB: May 20, 1980, 34 y.o.   MRN: [id number] Subjective:     [name] is a 34 y.o. female here for a routine exam.  Patient's last menstrual period was 01/31/2015. [id number] Birth Control Method:  abstinence Menstrual Calendar(currently): regular  Current complaints: Mother recently passed from peritoneal adenocarcinoma, ?primary vs origin.   Current acute medical issues:  none   Recent Gynecologic History Patient's last menstrual period was 01/31/2015. Last Pap: 2015,  normal Last mammogram:  ,    Past Medical History  Diagnosis Date  . No pertinent past medical history   . Depression   . Pregnancy with other poor obstetric history(V23.49)   . Tobacco use disorder   . Emotional lability   . Lumbago   . Abnormal Pap smear     Past Surgical History  Procedure Laterality Date  . Dilation and curettage of uterus      OB History    Gravida Para Term Preterm AB TAB SAB Ectopic Multiple Living   0 1 0 1 0 0 5      Social History   Social History  . Marital Status: Married    Spouse Name: N/A  . Number of Children: N/A  . Years of Education: N/A   Social History Main Topics  . Smoking status: Current Every Day Smoker -- 0.50 packs/day    Types: Cigarettes  . Smokeless tobacco: Never Used  . Alcohol Use: No  . Drug Use: No  . Sexual Activity: Yes   Other Topics Concern  . None   Social History Narrative    Family History  Problem Relation Age of Onset  . Hyperlipidemia Mother   . Hypertension Mother   . Heart disease Father   . Kidney disease Father     kidney stones  . Heart attack Father   . Cancer Paternal Grandmother     No current outpatient prescriptions on file.  Review of Systems  Review of Systems  Constitutional: Negative for fever, chills, weight loss, malaise/fatigue and diaphoresis.  HENT: Negative for hearing loss, ear pain, nosebleeds, congestion, sore throat, neck pain, tinnitus and ear  discharge.   Eyes: Negative for blurred vision, double vision, photophobia, pain, discharge and redness.  Respiratory: Negative for cough, hemoptysis, sputum production, shortness of breath, wheezing and stridor.   Cardiovascular: Negative for chest pain, palpitations, orthopnea, claudication, leg swelling and PND.  Gastrointestinal: negative for abdominal pain. Negative for heartburn, nausea, vomiting, diarrhea, constipation, blood in stool and melena.  Genitourinary: Negative for dysuria, urgency, frequency, hematuria and flank pain.  Musculoskeletal: Negative for myalgias, back pain, joint pain and falls.  Skin: Negative for itching and rash.  Neurological: Negative for dizziness, tingling, tremors, sensory change, speech change, focal weakness, seizures, loss of consciousness, weakness and headaches.  Endo/Heme/Allergies: Negative for environmental allergies and polydipsia. Does not bruise/bleed easily.  Psychiatric/Behavioral: Negative for depression, suicidal ideas, hallucinations, memory loss and substance abuse. The patient is not nervous/anxious and does not have insomnia.        Objective:  Blood pressure 120/60, pulse 80, weight 182 lb (82.555 kg), last menstrual period 01/31/2015, unknown if currently breastfeeding.   Physical Exam  Vitals reviewed. Constitutional: She is oriented to person, place, and time. She appears well-developed and well-nourished.  HENT:  Head: Normocephalic and atraumatic.        Right Ear: External ear normal.  Left Ear: External ear normal.  Nose: Nose normal.  Mouth/Throat: Oropharynx is  clear and moist.  Eyes: Conjunctivae and EOM are normal. Pupils are equal, round, and reactive to light. Right eye exhibits no discharge. Left eye exhibits no discharge. No scleral icterus.  Neck: Normal range of motion. Neck supple. No tracheal deviation present. No thyromegaly present.  Cardiovascular: Normal rate, regular rhythm, normal heart sounds and intact  distal pulses.  Exam reveals no gallop and no friction rub.   No murmur heard. Respiratory: Effort normal and breath sounds normal. No respiratory distress. She has no wheezes. She has no rales. She exhibits no tenderness.  GI: Soft. Bowel sounds are normal. She exhibits no distension and no mass. There is no tenderness. There is no rebound and no guarding.  Genitourinary:  Breasts no masses skin changes or nipple changes bilaterally      Vulva is normal without lesions Vagina is pink moist without discharge Cervix normal in appearance and pap is done Uterus is normal size shape and contour Adnexa is negative with normal sized ovaries   Musculoskeletal: Normal range of motion. She exhibits no edema and no tenderness.  Neurological: She is alert and oriented to person, place, and time. She has normal reflexes. She displays normal reflexes. No cranial nerve deficit. She exhibits normal muscle tone. Coordination normal.  Skin: Skin is warm and dry. No rash noted. No erythema. No pallor.  Psychiatric: She has a normal mood and affect. Her behavior is normal. Judgment and thought content normal.       Assessment:    Healthy female exam.   FH of peritoneal adenocarcinoma Plan:    Follow up in: 3 weeks.    Ca 125 + pelvic sonogram

## 2015-02-01 LAB — CYTOLOGY - PAP

## 2015-02-06 ENCOUNTER — Other Ambulatory Visit: Payer: PRIVATE HEALTH INSURANCE

## 2015-02-06 ENCOUNTER — Ambulatory Visit (HOSPITAL_BASED_OUTPATIENT_CLINIC_OR_DEPARTMENT_OTHER): Payer: PRIVATE HEALTH INSURANCE | Admitting: Genetic Counselor

## 2015-02-06 ENCOUNTER — Encounter: Payer: Self-pay | Admitting: Genetic Counselor

## 2015-02-06 DIAGNOSIS — Z8049 Family history of malignant neoplasm of other genital organs: Secondary | ICD-10-CM

## 2015-02-06 DIAGNOSIS — Z315 Encounter for genetic counseling: Secondary | ICD-10-CM | POA: Diagnosis not present

## 2015-02-06 DIAGNOSIS — Z8051 Family history of malignant neoplasm of kidney: Secondary | ICD-10-CM

## 2015-02-06 DIAGNOSIS — Z800 Family history of malignant neoplasm of digestive organs: Secondary | ICD-10-CM | POA: Diagnosis not present

## 2015-02-06 DIAGNOSIS — Z8041 Family history of malignant neoplasm of ovary: Secondary | ICD-10-CM | POA: Insufficient documentation

## 2015-02-06 NOTE — Progress Notes (Signed)
REFERRING PROVIDER: Ancil Linsey, MD  PRIMARY PROVIDER:  No primary care provider on file.  PRIMARY REASON FOR VISIT:  1. Family history of ovarian cancer   2. Family history of kidney cancer   3. Family history of cancer of female genital organ   4. Family history of colon cancer      HISTORY OF PRESENT ILLNESS:   Ms. Speelman, a 34 y.o. female, was seen for a [facility] cancer genetics consultation at the request of Dr. Whitney Muse due to a family history of cancer.  Ms. Traister presents to clinic today to discuss the possibility of a hereditary predisposition to cancer, genetic testing, and to further clarify her future cancer risks, as well as potential cancer risks for family members. Ms. Areola is a 34 y.o. female with no personal history of cancer.  Her mother died from ovarian cancer this past summer, and Ms. Nevers is interested in learning more about her genetic risk for cancer.  She is still experiencing a lot of grief from the death of her mother.  CANCER HISTORY:   No history exists.     HORMONAL RISK FACTORS:  Menarche was at age 33.  First live birth at age 50.  OCP use for approximately 4 years.  Ovaries intact: yes.  Hysterectomy: no.  Menopausal status: premenopausal.  HRT use: 0 years. Colonoscopy: no; not examined. Mammogram within the last year: no. Number of breast biopsies: 0. Up to date with pelvic exams:  yes. Any excessive radiation exposure in the past:  no  Past Medical History  Diagnosis Date  . No pertinent past medical history   . Depression   . Pregnancy with other poor obstetric history(V23.49)   . Tobacco use disorder   . Emotional lability   . Lumbago   . Abnormal Pap smear   . Family history of ovarian cancer   . Family history of kidney cancer     Past Surgical History  Procedure Laterality Date  . Dilation and curettage of uterus      Social History   Social History  . Marital Status: Married    Spouse Name: N/A  .  Number of Children: 5  . Years of Education: N/A   Social History Main Topics  . Smoking status: Current Every Day Smoker -- 0.50 packs/day    Types: Cigarettes  . Smokeless tobacco: Never Used  . Alcohol Use: No  . Drug Use: No  . Sexual Activity: Yes   Other Topics Concern  . None   Social History Narrative     FAMILY HISTORY:  We obtained a detailed, 4-generation family history.  Significant diagnoses are listed below: Family History  Problem Relation Age of Onset  . Hyperlipidemia Mother   . Hypertension Mother   . Ovarian cancer Mother 41  . Heart disease Father   . Kidney disease Father     kidney stones  . Heart attack Father   . Cancer Paternal Grandmother   . Kidney cancer Maternal Aunt 75  . Cancer Maternal Grandmother 36    female organ cancer  . Colon cancer Other     MGMs sister dx in her 49s   The patient has 5 children ranging from 2-18, all who are healthy.  She has two brothers who are cancer free.  Her mother passed away at 55 from ovarian cancer and her father died at 2 from CHF.  Her mother had four brothers and one sister.  The sister was diagnosed with  kidney cancer at 70.  The patient's maternal grandmother had cancer in the female organs at 73, reportedly it was stage III, but it is unknown the type of cancer.  Her grandmother's sister ahd colon cancer.  The family history on her father's side is limited as she was never around that family.  Patient's maternal ancestors are of Caucasian descent, and paternal ancestors are of Caucasian descent. There is no reported Ashkenazi Jewish ancestry. There is no known consanguinity.  GENETIC COUNSELING ASSESSMENT: Galaxy Borden is a 34 y.o. female with a family history of ovarian, kidney and colon cancer which is somewhat suggestive of a hereditary cancer sydnrome and predisposition to cancer. We, therefore, discussed and recommended the following at today's visit.   DISCUSSION: We discussed that about 20% of  individuals with ovairan cancer have a hereditary cancer syndrome.  When we see kidney cancer and other GI cancers in the family it makes [place] think about Lynch syndrome.  However, BRCA mutations are the most common cause of hereditary ovarian cancer.  Ms. Espejo reports that there is cancer on her father's side of the family, however, she does not know anyone on his side of the family and therefore does not have specifics.  We reviewed the characteristics, features and inheritance patterns of hereditary cancer syndromes. We also discussed genetic testing, including the appropriate family members to test, the process of testing, insurance coverage and turn-around-time for results. We discussed the implications of a negative, positive and/or variant of uncertain significant result. We recommended Ms. Rehfeldt pursue genetic testing for the heredit gene panel.   Based on Ms. Reinig's family history of cancer, she meets medical criteria for genetic testing. Despite that she meets criteria, she may still have an out of pocket cost. We discussed that if her out of pocket cost for testing is over $100, the laboratory will call and confirm whether she wants to proceed with testing.  If the out of pocket cost of testing is less than $100 she will be billed by the genetic testing laboratory.   Ms. Kerce is still grieving the loss of her mother and father in 2016.  We discussed issues surrounding her mother's diagnosis and why her mother was not tested.  She feels regret that her mother did not get testing but the diagnosis and her death was so quick that testing became difficult.  PLAN: After considering the risks, benefits, and limitations, Ms. Fors  provided informed consent to pursue genetic testing and the blood sample was sent to [facility] for analysis of the Hereditary cancer panel. Results should be available within approximately 2-3 weeks' time, at which point they will be disclosed by  telephone to Ms. Hammerstrom, as will any additional recommendations warranted by these results. Ms. Mcmasters will receive a summary of her genetic counseling visit and a copy of her results once available. This information will also be available in Epic. We encouraged Ms. Slee to remain in contact with cancer genetics annually so that we can continuously update the family history and inform her of any changes in cancer genetics and testing that may be of benefit for her family. Ms. Loudin questions were answered to her satisfaction today. Our contact information was provided should additional questions or concerns arise.  Lastly, we encouraged Ms. Kimbler to remain in contact with cancer genetics annually so that we can continuously update the family history and inform her of any changes in cancer genetics and testing that may be of benefit for this  family.   Ms.  Alf questions were answered to her satisfaction today. Our contact information was provided should additional questions or concerns arise. Thank you for the referral and allowing [place] to share in the care of your patient.   Valisa Karpel P. Florene Glen, [place], [facility] Certified Genetic Counselor [email or web address] phone: [phone]  The patient was seen for a total of 60 minutes in face-to-face genetic counseling.  This patient was discussed with Drs. Magrinat, Lindi Adie and/or Burr Medico who agrees with the above.    _______________________________________________________________________ For Office Staff:  Number of people involved in session: 1 Was an Intern/ student involved with case: no

## 2015-02-19 ENCOUNTER — Encounter: Payer: Self-pay | Admitting: Genetic Counselor

## 2015-02-19 ENCOUNTER — Telehealth: Payer: Self-pay | Admitting: Genetic Counselor

## 2015-02-19 ENCOUNTER — Ambulatory Visit: Payer: Self-pay | Admitting: Genetic Counselor

## 2015-02-19 DIAGNOSIS — Z1379 Encounter for other screening for genetic and chromosomal anomalies: Secondary | ICD-10-CM

## 2015-02-19 DIAGNOSIS — Z8051 Family history of malignant neoplasm of kidney: Secondary | ICD-10-CM

## 2015-02-19 DIAGNOSIS — Z8041 Family history of malignant neoplasm of ovary: Secondary | ICD-10-CM

## 2015-02-19 NOTE — Telephone Encounter (Signed)
LM on VM with good news.  Gave CB instructions. 

## 2015-02-19 NOTE — Telephone Encounter (Signed)
Revealed negative genetic testing on the hereditary cancer panel through [facility].

## 2015-02-19 NOTE — Progress Notes (Signed)
HPI: [name] was previously seen in the  Cancer Genetics clinic due to a family history of cancer and concerns regarding a hereditary predisposition to cancer. Please refer to our prior cancer genetics clinic note for more information regarding [name]'s medical, social and family histories, and our assessment and recommendations, at the time. [name]'s recent genetic test results were disclosed to her, as were recommendations warranted by these results. These results and recommendations are discussed in more detail below.  FAMILY HISTORY:  We obtained a detailed, 4-generation family history.  Significant diagnoses are listed below: Family History  Problem Relation Age of Onset  . Hyperlipidemia Mother   . Hypertension Mother   . Ovarian cancer Mother 58  . Heart disease Father   . Kidney disease Father     kidney stones  . Heart attack Father   . Cancer Paternal Grandmother   . Kidney cancer Maternal Aunt 50  . Cancer Maternal Grandmother 36    female organ cancer  . Colon cancer Other     MGMs sister dx in her 60s    The patient has 5 children ranging from 2-18, all who are healthy. She has two brothers who are cancer free. Her mother passed away at 58 from ovarian cancer and her father died at 61 from CHF. Her mother had four brothers and one sister. The sister was diagnosed with kidney cancer at 50. The patient's maternal grandmother had cancer in the female organs at 36, reportedly it was stage III, but it is unknown the type of cancer. Her grandmother's sister ahd colon cancer. The family history on her father's side is limited as she was never around that family. Patient's maternal ancestors are of Caucasian descent, and paternal ancestors are of Caucasian descent. There is no reported Ashkenazi Jewish ancestry. There is no known consanguinity.  GENETIC TEST RESULTS: At the time of [name]'s visit, we recommended she pursue genetic testing of the  Common Hereditary Cancers gene panel. The Hereditary Gene Panel offered by [facility] includes sequencing and/or deletion duplication testing of the following 42 genes: APC, ATM, AXIN2, BARD1, BMPR1A, BRCA1, BRCA2, BRIP1, CDH1, CDKN2A, CHEK2, DICER1, EPCAM, GREM1, KIT, MEN1, MLH1, MSH2, MSH6, MUTYH, NBN, NF1, PALB2, PDGFRA, PMS2, POLD1, POLE, PTEN, RAD50, RAD51C, RAD51D, SDHA, SDHB, SDHC, SDHD, SMAD4, SMARCA4. STK11, TP53, TSC1, TSC2, and VHL.    The report date is February 18, 2015..  Genetic testing was normal, and did not reveal a deleterious mutation in these genes. The test report has been scanned into EPIC and is located under the Molecular Pathology section of the Results Review tab.   We discussed with [name] that since the current genetic testing is not perfect, it is possible there may be a gene mutation in one of these genes that current testing cannot detect, but that chance is small. We also discussed, that it is possible that another gene that has not yet been discovered, or that we have not yet tested, is responsible for the cancer diagnoses in the family, and it is, therefore, important to remain in touch with cancer genetics in the future so that we can continue to offer [name] the most up to date genetic testing.   CANCER SCREENING RECOMMENDATIONS: This normal result is reassuring and indicates that [name] does not likely have an increased risk of cancer due to a mutation in one of these genes.  We, therefore, recommended  [name] continue to follow the cancer screening guidelines provided   by her primary healthcare providers.   RECOMMENDATIONS FOR FAMILY MEMBERS: Women in this family might be at some increased risk of developing cancer, over the general population risk, simply due to the family history of cancer. We recommended women in this family have a yearly mammogram beginning at age 20, or 79 years younger than the earliest onset of cancer, an an annual clinical  breast exam, and perform monthly breast self-exams. Women in this family should also have a gynecological exam as recommended by their primary provider. All family members should have a colonoscopy by age 69.  FOLLOW-UP: Lastly, we discussed with [name] that cancer genetics is a rapidly advancing field and it is possible that new genetic tests will be appropriate for her and/or her family members in the future. We encouraged her to remain in contact with cancer genetics on an annual basis so we can update her personal and family histories and let her know of advances in cancer genetics that may benefit this family.   Our contact number was provided. [name] questions were answered to her satisfaction, and she knows she is welcome to call us at anytime with additional questions or concerns.   Roma Kayser, MS, [facility] Certified Genetic Counselor [email or web address]

## 2015-02-21 ENCOUNTER — Ambulatory Visit (INDEPENDENT_AMBULATORY_CARE_PROVIDER_SITE_OTHER): Payer: PRIVATE HEALTH INSURANCE

## 2015-02-21 ENCOUNTER — Encounter: Payer: Self-pay | Admitting: Obstetrics & Gynecology

## 2015-02-21 ENCOUNTER — Ambulatory Visit (INDEPENDENT_AMBULATORY_CARE_PROVIDER_SITE_OTHER): Payer: PRIVATE HEALTH INSURANCE | Admitting: Obstetrics & Gynecology

## 2015-02-21 VITALS — BP 110/80 | HR 76 | Ht 63.0 in | Wt 185.0 lb

## 2015-02-21 DIAGNOSIS — Z809 Family history of malignant neoplasm, unspecified: Secondary | ICD-10-CM | POA: Diagnosis not present

## 2015-02-21 DIAGNOSIS — Z8041 Family history of malignant neoplasm of ovary: Secondary | ICD-10-CM | POA: Diagnosis not present

## 2015-02-21 DIAGNOSIS — N854 Malposition of uterus: Secondary | ICD-10-CM | POA: Diagnosis not present

## 2015-02-21 NOTE — Progress Notes (Signed)
PELVIC US TA/TV: homogenous anteverted uterus,EEC 4.396mm,normal ov's bilat(mobile),no free fluid,no pain during ultrasound

## 2015-02-22 LAB — CA 125: CA 125: 16.1 U/mL (ref 0.0–38.1)

## 2015-03-07 ENCOUNTER — Telehealth: Payer: Self-pay | Admitting: Obstetrics & Gynecology

## 2015-03-07 NOTE — Telephone Encounter (Signed)
Pt states would like soonest available appt with Dr.Eure to discuss tubal ligation. What to have procedure done as soon as possible.

## 2015-03-08 ENCOUNTER — Ambulatory Visit (INDEPENDENT_AMBULATORY_CARE_PROVIDER_SITE_OTHER): Payer: PRIVATE HEALTH INSURANCE | Admitting: Obstetrics & Gynecology

## 2015-03-08 ENCOUNTER — Encounter: Payer: Self-pay | Admitting: Obstetrics & Gynecology

## 2015-03-08 VITALS — BP 110/60 | HR 84 | Ht 63.0 in | Wt 185.0 lb

## 2015-03-08 DIAGNOSIS — Z809 Family history of malignant neoplasm, unspecified: Secondary | ICD-10-CM

## 2015-03-08 DIAGNOSIS — Z01818 Encounter for other preprocedural examination: Secondary | ICD-10-CM

## 2015-03-15 NOTE — Patient Instructions (Signed)
[name]  03/15/2015     @   Your procedure is scheduled on 03/22/2015.  Report to [facility] at 6:15 A.M.  Call this number if you have problems the morning of surgery:  [phone]   Remember:  Do not eat food or drink liquids after midnight.  Take these medicines the morning of surgery with A SIP OF WATER NA   Do not wear jewelry, make-up or nail polish.  Do not wear lotions, powders, or perfumes.  You may wear deodorant.  Do not shave 48 hours prior to surgery.  Men may shave face and neck.  Do not bring valuables to the hospital.  [facility] is not responsible for any belongings or valuables.  Contacts, dentures or bridgework may not be worn into surgery.  Leave your suitcase in the car.  After surgery it may be brought to your room.  For patients admitted to the hospital, discharge time will be determined by your treatment team.  Patients discharged the day of surgery will not be allowed to drive home.    Please read over the following fact sheets that you were given. Surgical Site Infection Prevention and Anesthesia Post-op Instructions     PATIENT INSTRUCTIONS POST-ANESTHESIA  IMMEDIATELY FOLLOWING SURGERY:  Do not drive or operate machinery for the first twenty four hours after surgery.  Do not make any important decisions for twenty four hours after surgery or while taking narcotic pain medications or sedatives.  If you develop intractable nausea and vomiting or a severe headache please notify your doctor immediately.  FOLLOW-UP:  Please make an appointment with your surgeon as instructed. You do not need to follow up with anesthesia unless specifically instructed to do so.  WOUND CARE INSTRUCTIONS (if applicable):  Keep a dry clean dressing on the anesthesia/puncture wound site if there is drainage.  Once the wound has quit draining you may leave it open to air.  Generally you should leave the bandage intact for twenty four hours unless  there is drainage.  If the epidural site drains for more than 36-48 hours please call the anesthesia department.  QUESTIONS?:  Please feel free to call your physician or the hospital operator if you have any questions, and they will be happy to assist you.      Salpingectomy Salpingectomy, also called tubectomy, is the surgical removal of one of the fallopian tubes. The fallopian tubes are tubes that are connected to the uterus. These tubes transport the egg from the ovary to the uterus. A salpingectomy may be done for various reasons, including:   A tubal (ectopic) pregnancy. This is especially true if the tube ruptures.  An infected fallopian tube.  The need to remove the fallopian tube when removing an ovary with a cyst or tumor.  The need to remove the fallopian tube when removing the uterus.  Cancer of the fallopian tube or nearby organs. Removing one fallopian tube does not prevent you from becoming pregnant. It also does not cause problems with your menstrual periods.  LET [facility] CARE PROVIDER KNOW ABOUT:  Any allergies you have.  All medicines you are taking, including vitamins, herbs, eye drops, creams, and over-the-counter medicines.  Previous problems you or members of your family have had with the use of anesthetics.  Any blood disorders you have.  Previous surgeries you have had.  Medical conditions you have. RISKS AND COMPLICATIONS  Generally, this is a safe procedure. However, as with any procedure, complications can occur.  Possible complications include:  Injury to surrounding organs.  Bleeding.  Infection.  Problems related to anesthesia. BEFORE THE PROCEDURE  Ask your health care provider about changing or stopping your regular medicines. You may need to stop taking certain medicines, such as aspirin or blood thinners, at least 1 week before the surgery.  Do not eat or drink anything for at least 8 hours before the surgery.  If you smoke, do not  smoke for at least 2 weeks before the surgery.  Make plans to have someone drive you home after the procedure or after your hospital stay. Also arrange for someone to help you with activities during recovery. PROCEDURE   You will be given medicine to help you relax before the procedure (sedative). You will then be given medicine to make you sleep through the procedure (general anesthetic). These medicines will be given through an IV access tube that is put into one of your veins.  Once you are asleep, your lower abdomen will be shaved and cleaned. A thin, flexible tube (catheter) will be placed in your bladder.  The surgeon may use a laparoscopic, robotic, or open technique for this surgery:  In the laparoscopic technique, the surgery is done through two small cuts (incisions) in the abdomen. A thin, lighted tube with a tiny camera on the end (laparoscope) is inserted into one of the incisions. The tools needed for the procedure are put through the other incision.  A robotic technique may be chosen to perform complex surgery in a small space. In the robotic technique, small incisions will be made. A camera and surgical instruments are passed through the incisions. Surgical instruments will be controlled with the help of a robotic arm.  In the open technique, the surgery is done through one large incision in the abdomen.  Using any of these techniques, the surgeon removes the fallopian tube from where it attaches to the uterus. The blood vessels will be clamped and tied.  The surgeon then uses staples or stitches to close the incision or incisions. AFTER THE PROCEDURE   You will be taken to a recovery area where your progress will be monitored for 1-3 hours.  If the laparoscopic technique was used, you may be allowed to go home after several hours. You may have some shoulder pain after the laparoscopic procedure. This is normal and usually goes away in a day or two.  If the open technique  was used, you will be admitted to the hospital for a couple of days.  You will be given pain medicine if needed.  The IV access tube and catheter will be removed before you are discharged.   This information is not intended to replace advice given to you by your health care provider. Make sure you discuss any questions you have with your health care provider.   Document Released: 06/21/2008 Document Revised: 02/23/2014 Document Reviewed: 07/27/2012 Elsevier Interactive Patient Education [employer]

## 2015-03-18 ENCOUNTER — Other Ambulatory Visit: Payer: Self-pay | Admitting: Obstetrics & Gynecology

## 2015-03-18 ENCOUNTER — Encounter (HOSPITAL_COMMUNITY)
Admission: RE | Admit: 2015-03-18 | Discharge: 2015-03-18 | Disposition: A | Discharge status: Home / Self Care | Payer: PRIVATE HEALTH INSURANCE | Source: Ambulatory Visit | Attending: Obstetrics & Gynecology | Admitting: Obstetrics & Gynecology

## 2015-03-18 ENCOUNTER — Encounter (HOSPITAL_COMMUNITY): Payer: Self-pay

## 2015-03-18 DIAGNOSIS — Z79899 Other long term (current) drug therapy: Secondary | ICD-10-CM | POA: Diagnosis not present

## 2015-03-18 DIAGNOSIS — F329 Major depressive disorder, single episode, unspecified: Secondary | ICD-10-CM | POA: Insufficient documentation

## 2015-03-18 DIAGNOSIS — F17210 Nicotine dependence, cigarettes, uncomplicated: Secondary | ICD-10-CM | POA: Insufficient documentation

## 2015-03-18 DIAGNOSIS — Z9889 Other specified postprocedural states: Secondary | ICD-10-CM | POA: Diagnosis not present

## 2015-03-18 DIAGNOSIS — Z01812 Encounter for preprocedural laboratory examination: Secondary | ICD-10-CM | POA: Insufficient documentation

## 2015-03-18 LAB — CBC
HCT: 44.6 % (ref 36.0–46.0)
HEMOGLOBIN: 15.1 g/dL — AB (ref 12.0–15.0)
MCH: 30.9 pg (ref 26.0–34.0)
MCHC: 33.9 g/dL (ref 30.0–36.0)
MCV: 91.4 fL (ref 78.0–100.0)
Platelets: 256 10*3/uL (ref 150–400)
RBC: 4.88 MIL/uL (ref 3.87–5.11)
RDW: 13.5 % (ref 11.5–15.5)
WBC: 9.5 10*3/uL (ref 4.0–10.5)

## 2015-03-18 LAB — COMPREHENSIVE METABOLIC PANEL
ALBUMIN: 4.3 g/dL (ref 3.5–5.0)
ALK PHOS: 61 U/L (ref 38–126)
ALT: 12 U/L — ABNORMAL LOW (ref 14–54)
AST: 15 U/L (ref 15–41)
Anion gap: 9 (ref 5–15)
BILIRUBIN TOTAL: 0.8 mg/dL (ref 0.3–1.2)
BUN: 15 mg/dL (ref 6–20)
CALCIUM: 9.6 mg/dL (ref 8.9–10.3)
CO2: 24 mmol/L (ref 22–32)
Chloride: 104 mmol/L (ref 101–111)
Creatinine, Ser: 0.83 mg/dL (ref 0.44–1.00)
GFR calc Af Amer: >60 mL/min (ref >60)
GFR calc non Af Amer: >60 mL/min (ref >60)
GLUCOSE: 95 mg/dL (ref 65–99)
Potassium: 4.2 mmol/L (ref 3.5–5.1)
Sodium: 137 mmol/L (ref 135–145)
TOTAL PROTEIN: 7 g/dL (ref 6.5–8.1)

## 2015-03-18 LAB — URINALYSIS, ROUTINE W REFLEX MICROSCOPIC
BILIRUBIN URINE: NEGATIVE
GLUCOSE, UA: NEGATIVE mg/dL
Hgb urine dipstick: NEGATIVE
KETONES UR: NEGATIVE mg/dL
Leukocytes, UA: NEGATIVE
Nitrite: NEGATIVE
Protein, ur: NEGATIVE mg/dL
pH: 6 (ref 5.0–8.0)

## 2015-03-18 LAB — HCG, QUANTITATIVE, PREGNANCY: hCG, Beta Chain, Quant, S: <1 m[IU]/mL (ref <5)

## 2015-03-18 NOTE — Pre-Procedure Instructions (Signed)
Patient given information to sign up for my chart at home. 

## 2015-03-22 ENCOUNTER — Encounter (HOSPITAL_COMMUNITY): Payer: Self-pay | Admitting: *Deleted

## 2015-03-22 ENCOUNTER — Ambulatory Visit (HOSPITAL_COMMUNITY): Payer: PRIVATE HEALTH INSURANCE | Admitting: Anesthesiology

## 2015-03-22 ENCOUNTER — Encounter (HOSPITAL_COMMUNITY)
Admission: RE | Disposition: A | Discharge status: Home / Self Care | Payer: Self-pay | Source: Ambulatory Visit | Attending: Obstetrics & Gynecology

## 2015-03-22 ENCOUNTER — Ambulatory Visit (HOSPITAL_COMMUNITY)
Admission: RE | Admit: 2015-03-22 | Discharge: 2015-03-22 | Disposition: A | Discharge status: Home / Self Care | Payer: PRIVATE HEALTH INSURANCE | Source: Ambulatory Visit | Attending: Obstetrics & Gynecology | Admitting: Obstetrics & Gynecology

## 2015-03-22 DIAGNOSIS — Z302 Encounter for sterilization: Secondary | ICD-10-CM | POA: Insufficient documentation

## 2015-03-22 DIAGNOSIS — F17210 Nicotine dependence, cigarettes, uncomplicated: Secondary | ICD-10-CM | POA: Insufficient documentation

## 2015-03-22 HISTORY — PX: LAPAROSCOPIC BILATERAL SALPINGECTOMY: SHX5889

## 2015-03-22 SURGERY — SALPINGECTOMY, BILATERAL, LAPAROSCOPIC
Anesthesia: General | Site: Vagina | Laterality: Bilateral

## 2015-03-22 MED ORDER — LIDOCAINE HCL (PF) 1 % IJ SOLN
INTRAMUSCULAR | Status: AC
  Filled 2015-03-22: qty 5

## 2015-03-22 MED ORDER — ONDANSETRON HCL 4 MG/2ML IJ SOLN: 4.0000 mg | Freq: Once | INTRAMUSCULAR | Status: DC | PRN

## 2015-03-22 MED ORDER — ROCURONIUM BROMIDE 100 MG/10ML IV SOLN
INTRAVENOUS | Status: DC | PRN
  Administered 2015-03-22: 30 mg via INTRAVENOUS

## 2015-03-22 MED ORDER — KETOROLAC TROMETHAMINE 10 MG PO TABS: 10.0000 mg | ORAL_TABLET | Freq: Three times a day (TID) | ORAL | Status: DC | PRN

## 2015-03-22 MED ORDER — LACTATED RINGERS IV SOLN
INTRAVENOUS | Status: DC
  Administered 2015-03-22 (×2): via INTRAVENOUS

## 2015-03-22 MED ORDER — KETOROLAC TROMETHAMINE 30 MG/ML IJ SOLN
30.0000 mg | Freq: Once | INTRAMUSCULAR | Status: AC
  Administered 2015-03-22: 30 mg via INTRAVENOUS

## 2015-03-22 MED ORDER — FENTANYL CITRATE (PF) 100 MCG/2ML IJ SOLN
INTRAMUSCULAR | Status: DC | PRN
  Administered 2015-03-22 (×5): 50 ug via INTRAVENOUS

## 2015-03-22 MED ORDER — NEOSTIGMINE METHYLSULFATE 10 MG/10ML IV SOLN
INTRAVENOUS | Status: DC | PRN
  Administered 2015-03-22: 4 mg via INTRAVENOUS

## 2015-03-22 MED ORDER — BUPIVACAINE LIPOSOME 1.3 % IJ SUSP: 20.0000 mL | Freq: Once | INTRAMUSCULAR | Status: DC

## 2015-03-22 MED ORDER — CEFAZOLIN SODIUM-DEXTROSE 2-3 GM-% IV SOLR
2.0000 g | INTRAVENOUS | Status: AC
  Administered 2015-03-22: 2 g via INTRAVENOUS

## 2015-03-22 MED ORDER — MIDAZOLAM HCL 2 MG/2ML IJ SOLN
1.0000 mg | INTRAMUSCULAR | Status: DC | PRN
  Administered 2015-03-22: 2 mg via INTRAVENOUS

## 2015-03-22 MED ORDER — GLYCOPYRROLATE 0.2 MG/ML IJ SOLN
INTRAMUSCULAR | Status: AC
  Filled 2015-03-22: qty 3

## 2015-03-22 MED ORDER — ARTIFICIAL TEARS OP OINT
TOPICAL_OINTMENT | OPHTHALMIC | Status: AC
  Filled 2015-03-22: qty 3.5

## 2015-03-22 MED ORDER — GLYCOPYRROLATE 0.2 MG/ML IJ SOLN
INTRAMUSCULAR | Status: DC | PRN
  Administered 2015-03-22: 0.6 mg via INTRAVENOUS

## 2015-03-22 MED ORDER — PROPOFOL 10 MG/ML IV BOLUS
INTRAVENOUS | Status: AC
  Filled 2015-03-22: qty 20

## 2015-03-22 MED ORDER — FENTANYL CITRATE (PF) 100 MCG/2ML IJ SOLN
25.0000 ug | INTRAMUSCULAR | Status: DC | PRN
  Administered 2015-03-22 (×3): 50 ug via INTRAVENOUS
  Filled 2015-03-22 (×2): qty 2

## 2015-03-22 MED ORDER — BUPIVACAINE LIPOSOME 1.3 % IJ SUSP
INTRAMUSCULAR | Status: DC | PRN
  Administered 2015-03-22: 20 mL

## 2015-03-22 MED ORDER — BUPIVACAINE LIPOSOME 1.3 % IJ SUSP
INTRAMUSCULAR | Status: AC
  Filled 2015-03-22: qty 20

## 2015-03-22 MED ORDER — SODIUM CHLORIDE 0.9 % IR SOLN
Status: DC | PRN
  Administered 2015-03-22: 800 mL

## 2015-03-22 MED ORDER — MIDAZOLAM HCL 2 MG/2ML IJ SOLN
INTRAMUSCULAR | Status: AC
  Filled 2015-03-22: qty 2

## 2015-03-22 MED ORDER — KETOROLAC TROMETHAMINE 30 MG/ML IJ SOLN
INTRAMUSCULAR | Status: AC
  Filled 2015-03-22: qty 1

## 2015-03-22 MED ORDER — FENTANYL CITRATE (PF) 250 MCG/5ML IJ SOLN
INTRAMUSCULAR | Status: AC
Start: 2015-03-22 — End: 2015-03-22
  Filled 2015-03-22: qty 5

## 2015-03-22 MED ORDER — LIDOCAINE HCL (CARDIAC) 20 MG/ML IV SOLN
INTRAVENOUS | Status: DC | PRN
  Administered 2015-03-22: 25 mg via INTRAVENOUS
  Administered 2015-03-22: 50 mg via INTRAVENOUS

## 2015-03-22 MED ORDER — ONDANSETRON HCL 8 MG PO TABS: 8.0000 mg | ORAL_TABLET | Freq: Three times a day (TID) | ORAL | Status: DC | PRN

## 2015-03-22 MED ORDER — HYDROCODONE-ACETAMINOPHEN 5-325 MG PO TABS: 1.0000 | ORAL_TABLET | Freq: Four times a day (QID) | ORAL | Status: DC | PRN

## 2015-03-22 MED ORDER — ROCURONIUM BROMIDE 50 MG/5ML IV SOLN
INTRAVENOUS | Status: AC
  Filled 2015-03-22: qty 1

## 2015-03-22 MED ORDER — PROPOFOL 10 MG/ML IV BOLUS
INTRAVENOUS | Status: DC | PRN
  Administered 2015-03-22: 150 mg via INTRAVENOUS
  Administered 2015-03-22: 50 mg via INTRAVENOUS

## 2015-03-22 MED ORDER — CEFAZOLIN SODIUM-DEXTROSE 2-3 GM-% IV SOLR
INTRAVENOUS | Status: AC
  Filled 2015-03-22: qty 50

## 2015-03-22 SURGICAL SUPPLY — 47 items
APPLIER CLIP 5 13 M/L LIGAMAX5 (MISCELLANEOUS)
APR CLP MED LRG 5 ANG JAW (MISCELLANEOUS)
BAG HAMPER (MISCELLANEOUS) ×3 IMPLANT
BLADE SURG SZ11 CARB STEEL (BLADE) ×3 IMPLANT
CLIP APPLIE 5 13 M/L LIGAMAX5 (MISCELLANEOUS) IMPLANT
CLOTH BEACON ORANGE TIMEOUT ST (SAFETY) ×3 IMPLANT
COVER LIGHT HANDLE STERIS (MISCELLANEOUS) ×6 IMPLANT
DRAPE PROXIMA HALF (DRAPES) ×3 IMPLANT
ELECT REM PT RETURN 9FT ADLT (ELECTROSURGICAL) ×3
ELECTRODE REM PT RTRN 9FT ADLT (ELECTROSURGICAL) ×1 IMPLANT
FILTER SMOKE EVAC LAPAROSHD (FILTER) IMPLANT
FORMALIN 10 PREFIL 120ML (MISCELLANEOUS) ×4 IMPLANT
GLOVE BIOGEL M 7.0 STRL (GLOVE) ×4 IMPLANT
GLOVE BIOGEL PI IND STRL 7.0 (GLOVE) ×1 IMPLANT
GLOVE BIOGEL PI IND STRL 8 (GLOVE) ×1 IMPLANT
GLOVE BIOGEL PI INDICATOR 7.0 (GLOVE) ×2
GLOVE BIOGEL PI INDICATOR 8 (GLOVE) ×2
GLOVE ECLIPSE 8.0 STRL XLNG CF (GLOVE) ×3 IMPLANT
GLOVE INDICATOR 7.0 STRL GRN (GLOVE) ×4 IMPLANT
GOWN STRL REUS W/TWL LRG LVL3 (GOWN DISPOSABLE) ×3 IMPLANT
GOWN STRL REUS W/TWL XL LVL3 (GOWN DISPOSABLE) ×3 IMPLANT
INST SET LAPROSCOPIC GYN AP (KITS) ×3 IMPLANT
IV NS IRRIG 3000ML ARTHROMATIC (IV SOLUTION) IMPLANT
KIT ROOM TURNOVER AP CYSTO (KITS) ×3 IMPLANT
MANIFOLD NEPTUNE II (INSTRUMENTS) ×3 IMPLANT
NDL HYPO 21X1.5 SAFETY (NEEDLE) ×1 IMPLANT
NDL INSUFFLATION 14GA 120MM (NEEDLE) ×1 IMPLANT
NEEDLE HYPO 21X1.5 SAFETY (NEEDLE) ×3 IMPLANT
NEEDLE INSUFFLATION 14GA 120MM (NEEDLE) ×3 IMPLANT
PACK PERI GYN (CUSTOM PROCEDURE TRAY) ×3 IMPLANT
PAD ARMBOARD 7.5X6 YLW CONV (MISCELLANEOUS) ×3 IMPLANT
SET BASIN LINEN APH (SET/KITS/TRAYS/PACK) ×3 IMPLANT
SET TUBE IRRIG SUCTION NO TIP (IRRIGATION / IRRIGATOR) IMPLANT
SHEARS HARMONIC ACE PLUS 36CM (ENDOMECHANICALS) ×3 IMPLANT
SLEEVE ENDOPATH XCEL 5M (ENDOMECHANICALS) ×3 IMPLANT
SOLUTION ANTI FOG 6CC (MISCELLANEOUS) ×3 IMPLANT
SPONGE GAUZE 2X2 8PLY STER LF (GAUZE/BANDAGES/DRESSINGS) ×3
SPONGE GAUZE 2X2 8PLY STRL LF (GAUZE/BANDAGES/DRESSINGS) ×6 IMPLANT
STAPLER VISISTAT 35W (STAPLE) ×3 IMPLANT
SUT VICRYL 0 UR6 27IN ABS (SUTURE) ×3 IMPLANT
SYR 20CC LL (SYRINGE) ×3 IMPLANT
SYRINGE 10CC LL (SYRINGE) ×3 IMPLANT
TAPE CLOTH SURG 4X10 WHT LF (GAUZE/BANDAGES/DRESSINGS) ×2 IMPLANT
TROCAR ENDO BLADELESS 11MM (ENDOMECHANICALS) ×3 IMPLANT
TROCAR XCEL NON-BLD 5MMX100MML (ENDOMECHANICALS) ×3 IMPLANT
TUBING INSUF HEATED (TUBING) ×3 IMPLANT
WARMER LAPAROSCOPE (MISCELLANEOUS) ×3 IMPLANT

## 2015-03-22 NOTE — Anesthesia Procedure Notes (Signed)
Procedure Name: Intubation Date/Time: 03/22/2015 12:00 PM Performed by: Pernell Dupre, Neizan Debruhl A Pre-anesthesia Checklist: Patient identified, Patient being monitored, Timeout performed, Emergency Drugs available and Suction available Patient Re-evaluated:Patient Re-evaluated prior to inductionOxygen Delivery Method: Circle System Utilized Preoxygenation: Pre-oxygenation with 100% oxygen Intubation Type: IV induction Ventilation: Mask ventilation without difficulty Laryngoscope Size: 3 and Miller Grade View: Grade I Tube type: Oral Tube size: 7.0 mm Number of attempts: 1 Airway Equipment and Method: Stylet Placement Confirmation: ETT inserted through vocal cords under direct vision,  positive ETCO2 and breath sounds checked- equal and bilateral Secured at: 21 cm Tube secured with: Tape Dental Injury: Teeth and Oropharynx as per pre-operative assessment

## 2015-03-22 NOTE — H&P (Signed)
Preoperative History and Physical  [name] is a 35 y.o. [id number] with Patient's last menstrual period was 03/04/2015. admitted for a laparoscopic bilateral salpingectomy for sterilization.    PMH:    Past Medical History  Diagnosis Date  . No pertinent past medical history   . Depression   . Pregnancy with other poor obstetric history(V23.49)   . Tobacco use disorder   . Emotional lability   . Lumbago   . Abnormal Pap smear   . Family history of ovarian cancer   . Family history of kidney cancer     PSH:     Past Surgical History  Procedure Laterality Date  . Dilation and curettage of uterus    . Wisdom tooth extraction      she was in the 9th grade    POb/GynH:      OB History    Gravida Para Term Preterm AB TAB SAB Ectopic Multiple Living   6 5 5  0 1 0 1 0 0 5      SH:   Social History  Substance Use Topics  . Smoking status: Current Every Day Smoker -- 0.50 packs/day for 14 years    Types: Cigarettes  . Smokeless tobacco: Never Used  . Alcohol Use: No    FH:    Family History  Problem Relation Age of Onset  . Hyperlipidemia Mother   . Hypertension Mother   . Ovarian cancer Mother 5  . Heart disease Father   . Kidney disease Father     kidney stones  . Heart attack Father   . Cancer Paternal Grandmother   . Kidney cancer Maternal Aunt 50  . Cancer Maternal Grandmother 38    female organ cancer  . Colon cancer Other     MGMs sister dx in her 73s     Allergies: No Known Allergies  Medications:       Current facility-administered medications:  .  bupivacaine liposome (EXPAREL) 1.3 % injection 266 mg, 20 mL, Infiltration, Once, Lazaro Arms, MD .  ceFAZolin (ANCEF) IVPB 2 g/50 mL premix, 2 g, Intravenous, On Call to OR, Lazaro Arms, MD .  ketorolac (TORADOL) 30 MG/ML injection 30 mg, 30 mg, Intravenous, Once, Lazaro Arms, MD .  lactated ringers infusion, , Intravenous, Continuous, Benita Stabile, MD .  midazolam (VERSED) injection  1-2 mg, 1-2 mg, Intravenous, Q5 min PRN, Benita Stabile, MD  Review of Systems:   Review of Systems  Constitutional: Negative for fever, chills, weight loss, malaise/fatigue and diaphoresis.  HENT: Negative for hearing loss, ear pain, nosebleeds, congestion, sore throat, neck pain, tinnitus and ear discharge.   Eyes: Negative for blurred vision, double vision, photophobia, pain, discharge and redness.  Respiratory: Negative for cough, hemoptysis, sputum production, shortness of breath, wheezing and stridor.   Cardiovascular: Negative for chest pain, palpitations, orthopnea, claudication, leg swelling and PND.  Gastrointestinal: Positive for abdominal pain. Negative for heartburn, nausea, vomiting, diarrhea, constipation, blood in stool and melena.  Genitourinary: Negative for dysuria, urgency, frequency, hematuria and flank pain.  Musculoskeletal: Negative for myalgias, back pain, joint pain and falls.  Skin: Negative for itching and rash.  Neurological: Negative for dizziness, tingling, tremors, sensory change, speech change, focal weakness, seizures, loss of consciousness, weakness and headaches.  Endo/Heme/Allergies: Negative for environmental allergies and polydipsia. Does not bruise/bleed easily.  Psychiatric/Behavioral: Negative for depression, suicidal ideas, hallucinations, memory loss and substance abuse. The patient is not nervous/anxious and does not have insomnia.  PHYSICAL EXAM:  Last menstrual period 03/04/2015, unknown if currently breastfeeding.    Vitals reviewed. Constitutional: She is oriented to person, place, and time. She appears well-developed and well-nourished.  HENT:  Head: Normocephalic and atraumatic.  Right Ear: External ear normal.  Left Ear: External ear normal.  Nose: Nose normal.  Mouth/Throat: Oropharynx is clear and moist.  Eyes: Conjunctivae and EOM are normal. Pupils are equal, round, and reactive to light. Right eye exhibits no discharge.  Left eye exhibits no discharge. No scleral icterus.  Neck: Normal range of motion. Neck supple. No tracheal deviation present. No thyromegaly present.  Cardiovascular: Normal rate, regular rhythm, normal heart sounds and intact distal pulses.  Exam reveals no gallop and no friction rub.   No murmur heard. Respiratory: Effort normal and breath sounds normal. No respiratory distress. She has no wheezes. She has no rales. She exhibits no tenderness.  GI: Soft. Bowel sounds are normal. She exhibits no distension and no mass. There is tenderness. There is no rebound and no guarding.  Genitourinary:       Vulva is normal without lesions Vagina is pink moist without discharge Cervix normal in appearance and pap is normal Uterus is normal size, contour, position, consistency, mobility, non-tender Adnexa is negative with normal sized ovaries by sonogram  Musculoskeletal: Normal range of motion. She exhibits no edema and no tenderness.  Neurological: She is alert and oriented to person, place, and time. She has normal reflexes. She displays normal reflexes. No cranial nerve deficit. She exhibits normal muscle tone. Coordination normal.  Skin: Skin is warm and dry. No rash noted. No erythema. No pallor.  Psychiatric: She has a normal mood and affect. Her behavior is normal. Judgment and thought content normal.    Labs: Results for orders placed or performed during the hospital encounter of 03/18/15 (from the past 336 hour(s))  Urinalysis, Routine w reflex microscopic (not at [facility])   Collection Time: 03/18/15  9:08 AM  Result Value Ref Range   Color, Urine YELLOW YELLOW   APPearance CLEAR CLEAR   Specific Gravity, Urine <1.005 (L) 1.005 - 1.030   pH 6.0 5.0 - 8.0   Glucose, UA NEGATIVE NEGATIVE mg/dL   Hgb urine dipstick NEGATIVE NEGATIVE   Bilirubin Urine NEGATIVE NEGATIVE   Ketones, ur NEGATIVE NEGATIVE mg/dL   Protein, ur NEGATIVE NEGATIVE mg/dL   Nitrite NEGATIVE NEGATIVE   Leukocytes, UA  NEGATIVE NEGATIVE  CBC   Collection Time: 03/18/15  9:15 AM  Result Value Ref Range   WBC 9.5 4.0 - 10.5 K/uL   RBC 4.88 3.87 - 5.11 MIL/uL   Hemoglobin 15.1 (H) 12.0 - 15.0 g/dL   HCT 16.1 09.6 - 04.5 %   MCV 91.4 78.0 - 100.0 fL   MCH 30.9 26.0 - 34.0 pg   MCHC 33.9 30.0 - 36.0 g/dL   RDW 40.9 81.1 - 91.4 %   Platelets 256 150 - 400 K/uL  Comprehensive metabolic panel   Collection Time: 03/18/15  9:15 AM  Result Value Ref Range   Sodium 137 135 - 145 mmol/L   Potassium 4.2 3.5 - 5.1 mmol/L   Chloride 104 101 - 111 mmol/L   CO2 24 22 - 32 mmol/L   Glucose, Bld 95 65 - 99 mg/dL   BUN 15 6 - 20 mg/dL   Creatinine, Ser 7.82 0.44 - 1.00 mg/dL   Calcium 9.6 8.9 - 95.6 mg/dL   Total Protein 7.0 6.5 - 8.1 g/dL   Albumin 4.3 3.5 -  5.0 g/dL   AST 15 15 - 41 U/L   ALT 12 (L) 14 - 54 U/L   Alkaline Phosphatase 61 38 - 126 U/L   Total Bilirubin 0.8 0.3 - 1.2 mg/dL   GFR calc non Af Amer >60 >60 mL/min   GFR calc Af Amer >60 >60 mL/min   Anion gap 9 5 - 15  hCG, quantitative, pregnancy   Collection Time: 03/18/15  9:15 AM  Result Value Ref Range   hCG, Beta Chain, Quant, S <1 <5 mIU/mL    EKG: No orders found for this or any previous visit.  Imaging Studies: US Transvaginal Non-ob  2015/03/16  GYNECOLOGIC SONOGRAM Reona Babilonia is a 35 y.o. [id number] LMP 01/31/2015 for a pelvic sonogram for family HX of ovarian cancer. Uterus                      9.9 x 6.8 x 4.9 cm, homogenous anteverted uterus Endometrium          4.6 mm, symmetrical, wnl Right ovary             3.3 x 3.4 x 2.1 cm, wnl Left ovary                2.7 x 3.4 x 1.7 cm, wnl No free fluid seen Technician Comments: PELVIC US TA/TV: homogenous anteverted uterus,EEC 4.30mm,normal ov's bilat(mobile),no free fluid,no pain during ultrasound [employer] 03-16-15 9:02 AM Clinical Impression and recommendations: I have reviewed the sonogram results above, combined with the patient's current clinical course, below are my impressions and  any appropriate recommendations for management based on the sonographic findings. Normal uterus endometrium Specifically normal ovaries as it relates to CA 125 EURE,LUTHER H 03-16-15 10:49 AM   US Pelvis Complete  03-16-15  GYNECOLOGIC SONOGRAM Jahnya Trindade is a 35 y.o. [id number] LMP 01/31/2015 for a pelvic sonogram for family HX of ovarian cancer. Uterus                      9.9 x 6.8 x 4.9 cm, homogenous anteverted uterus Endometrium          4.6 mm, symmetrical, wnl Right ovary             3.3 x 3.4 x 2.1 cm, wnl Left ovary                2.7 x 3.4 x 1.7 cm, wnl No free fluid seen Technician Comments: PELVIC US TA/TV: homogenous anteverted uterus,EEC 4.75mm,normal ov's bilat(mobile),no free fluid,no pain during ultrasound [employer] March 16, 2015 9:02 AM Clinical Impression and recommendations: I have reviewed the sonogram results above, combined with the patient's current clinical course, below are my impressions and any appropriate recommendations for management based on the sonographic findings. Normal uterus endometrium Specifically normal ovaries as it relates to CA 125 EURE,LUTHER H 03/16/15 10:49 AM      Assessment: Multiparous female desires permanent sterilization Desires bilateral salpingectomy for ovarian cancer prophylaxis   Patient Active Problem List   Diagnosis Date Noted  . Genetic testing 02/19/2015  . Family history of ovarian cancer   . Family history of kidney cancer   . Postpartum care following vaginal delivery (4/2) 05/19/2011    Plan: Laparoscopic bilateral salpingectomy for sterilization  EURE,LUTHER H 03/22/2015 11:43 AM

## 2015-03-22 NOTE — Discharge Instructions (Signed)
Laparoscopic Tubal Ligation, Care After °Refer to this sheet in the next few weeks. These instructions provide you with information about caring for yourself after your procedure. Your health care provider may also give you more specific instructions. Your treatment has been planned according to current medical practices, but problems sometimes occur. Call your health care provider if you have any problems or questions after your procedure. °WHAT TO EXPECT AFTER THE PROCEDURE °After your procedure, it is common to have: °· Sore throat. °· Soreness at the incision site. °· Mild cramping. °· Tiredness. °· Mild nausea or vomiting. °· Shoulder pain. °HOME CARE INSTRUCTIONS °· Rest for the remainder of the day. °· Take medicines only as directed by your health care provider. These include over-the-counter medicines and prescription medicines. Do not take aspirin, which can cause bleeding. °· Over the next few days, gradually return to your normal activities and your normal diet. °· Avoid sexual intercourse for 2 weeks or as directed by your health care provider. °· Do not use tampons, and do not douche. °· Do not drive or operate heavy machinery while taking pain medicine. °· Do not lift anything that is heavier than 5 lb (2.3 kg) for 2 weeks or as directed by your health care provider. °· Do not take baths. Take showers only. Ask your health care provider when you can start taking baths. °· Take your temperature twice each day and write it down. °· Try to have help for your household needs for the first 7-10 days. °· There are many different ways to close and cover an incision, including stitches (sutures), skin glue, and adhesive strips. Follow instructions from your health care provider about: °¨ Incision care. °¨ Bandage (dressing) changes and removal. °¨ Incision closure removal. °· Check your incision area every day for signs of infection. Watch for: °¨ Redness, swelling, or pain. °¨ Fluid, blood, or pus. °· Keep  all follow-up visits as directed by your health care provider. °SEEK MEDICAL CARE IF: °· You have redness, swelling, or increasing pain in your incision area. °· You have fluid, blood, or pus coming from your incision for longer than 1 day. °· You notice a bad smell coming from your incision or your dressing. °· The edges of your incision break open after the sutures have been removed. °· Your pain does not decrease after 2-3 days. °· You have a rash. °· You repeatedly become dizzy or light-headed. °· You have a reaction to your medicine. °· Your pain medicine is not helping. °· You are constipated. °SEEK IMMEDIATE MEDICAL CARE IF: °· You have a fever. °· You faint. °· You have increasing pain in your abdomen. °· You have severe pain in one or both of your shoulders. °· You have bleeding or drainage from your suture sites or your vagina after surgery. °· You have shortness of breath or have difficulty breathing. °· You have chest pain or leg pain. °· You have ongoing nausea, vomiting, or diarrhea. °  °This information is not intended to replace advice given to you by your health care provider. Make sure you discuss any questions you have with your health care provider. °  °Document Released: 08/22/2004 Document Revised: 06/19/2014 Document Reviewed: 05/16/2011 °Elsevier Interactive Patient Education ©2016 [employer] ° °

## 2015-03-22 NOTE — Op Note (Signed)
Preoperative Diagnosis:  1.  Multiparous female desires permanent sterilization                                          2.  Elects to have bilateral salpingectomy for ovarian cancer                                                     prophylaxis  Postoperative Diagnosis:  Same as above  Procedure:  Laparoscopic Bilateral Salpingectomy  Surgeon:  Rockne Coons MD  Anaesthesia: general  Findings:  Patient had normal pelvic anatomy and no intraperitoneal abnormalities.  Description of Operation:  Patient was taken to the OR and placed into supine position where she underwent general anaesthesia.  She was placed in the dorsal lithotomy position and prepped and draped in the usual sterile fashion.  An incision was made in the umbilicus and dissection taken down to the rectus fascia which was incised and opened.  The non bladed trocar was then placed and the peritoneal cavity was insufflated.  The above noted findings were observed.  Additional trocars were placed in the right and left lower quadrants under direct visualization without difficulty.  The Harmonic scalpel was employed and salpingectomy of both the right and left tubes was performed.   The tubes were removed from the peritoneal cavity and sent to pathology.  There was good hemostasis bilaterally.  The fascia, peritoneum and subcutaneous tissue were closed using 0 vicryl.  The skin was closed using staples.  Exparel 266 mg 20 cc was injected in the 3 incision trocar sites. The patient was awakened from anaesthesia and taken to the PACU with all counts being correct x 3.  The patient received  2 gram of ancef andToradol 30 mg IV preoperatively.  Lazaro Arms 03/22/2015 12:35 PM

## 2015-03-22 NOTE — Anesthesia Preprocedure Evaluation (Signed)
Anesthesia Evaluation  Patient identified by MRN, date of birth, ID band Patient awake    Reviewed: Allergy & Precautions, NPO status   Airway Mallampati: II  TM Distance: >3 FB Neck ROM: Full    Dental  (+) Teeth Intact, Dental Advisory Given   Pulmonary Current Smoker,    Pulmonary exam normal        Cardiovascular Normal cardiovascular exam     Neuro/Psych Depression    GI/Hepatic   Endo/Other    Renal/GU      Musculoskeletal   Abdominal Normal abdominal exam  (+)   Peds  Hematology   Anesthesia Other Findings   Reproductive/Obstetrics                             Anesthesia Physical Anesthesia Plan  ASA: II  Anesthesia Plan: General   Post-op Pain Management:    Induction: Intravenous  Airway Management Planned: Oral ETT  Additional Equipment:   Intra-op Plan:   Post-operative Plan: Extubation in OR  Informed Consent: I have reviewed the patients History and Physical, chart, labs and discussed the procedure including the risks, benefits and alternatives for the proposed anesthesia with the patient or authorized representative who has indicated his/her understanding and acceptance.   Dental advisory given  Plan Discussed with: CRNA  Anesthesia Plan Comments:         Anesthesia Quick Evaluation

## 2015-03-22 NOTE — Transfer of Care (Signed)
Immediate Anesthesia Transfer of Care Note  Patient: [name]  Procedure(s) Performed: Procedure(s): LAPAROSCOPIC BILATERAL SALPINGECTOMY (Bilateral)  Patient Location: PACU  Anesthesia Type:General  Level of Consciousness: awake, oriented and patient cooperative  Airway & Oxygen Therapy: Patient Spontanous Breathing and Patient connected to face mask oxygen  Post-op Assessment: Report given to RN and Post -op Vital signs reviewed and stable  Post vital signs: Reviewed and stable  Last Vitals:  Filed Vitals:   03/22/15 1145 03/22/15 1150  BP: 120/73 120/79  Resp: 16 24    Complications: No apparent anesthesia complications

## 2015-03-22 NOTE — Anesthesia Postprocedure Evaluation (Signed)
Anesthesia Post Note  Patient: [name]  Procedure(s) Performed: Procedure(s) (LRB): LAPAROSCOPIC BILATERAL SALPINGECTOMY (Bilateral)  Patient location during evaluation: PACU Anesthesia Type: General Level of consciousness: awake and alert and oriented Pain management: pain level controlled Vital Signs Assessment: post-procedure vital signs reviewed and stable Respiratory status: spontaneous breathing and respiratory function stable Cardiovascular status: stable Postop Assessment: no signs of nausea or vomiting Anesthetic complications: no    Last Vitals:  Filed Vitals:   03/22/15 1150 03/22/15 1248  BP: 120/79 104/66  Pulse:  85  Temp:  36.5 C  Resp: 24 15    Last Pain:  Filed Vitals:   03/22/15 1309  PainSc: 5                  [name] A

## 2015-03-24 NOTE — Progress Notes (Signed)
Patient ID: [name], female   DOB: 06-Jun-1980, 35 y.o.   MRN: [id number] Follow up appointment for results  Chief Complaint  Patient presents with  . Follow-up    ultrasound / lab CA125 today.    Blood pressure 110/80, pulse 76, height  (1.6 m), weight 185 lb (83.915 kg), last menstrual period 01/31/2015, unknown if currently breastfeeding.  No results found.  GYNECOLOGIC SONOGRAM   [name] is a 35 y.o. [id number] LMP 01/31/2015 for a pelvic sonogram for family HX of ovarian cancer.  Uterus 9.9 x 6.8 x 4.9 cm, homogenous anteverted uterus  Endometrium 4.6 mm, symmetrical, wnl  Right ovary 3.3 x 3.4 x 2.1 cm, wnl  Left ovary 2.7 x 3.4 x 1.7 cm, wnl  No free fluid seen  Technician Comments:  PELVIC US TA/TV: homogenous anteverted uterus,EEC 4.58mm,normal ov's bilat(mobile),no free fluid,no pain during ultrasound     [name] 02/21/2015 9:02 AM  Clinical Impression and recommendations:  I have reviewed the sonogram results above, combined with the patient's current clinical course, below are my impressions and any appropriate recommendations for management based on the sonographic findings.  Normal uterus endometrium Specifically normal ovaries as it relates to CA 125   [name] 02/21/2015 10:49 AM   MEDS ordered this encounter: No orders of the defined types were placed in this encounter.    Orders for this encounter: Orders Placed This Encounter  Procedures  . CA 125    Plan:  Follow Up: Desires permanent sterilization Will schedule at patient's requested time    Face to face time:  10 minutes  Greater than 50% of the visit time was spent in counseling and coordination of care with the patient.  The summary and outline of the counseling and care coordination is summarized in the note above.   All questions were answered.  Past Medical History  Diagnosis Date  . No  pertinent past medical history   . Depression   . Pregnancy with other poor obstetric history(V23.49)   . Tobacco use disorder   . Emotional lability   . Lumbago   . Abnormal Pap smear   . Family history of ovarian cancer   . Family history of kidney cancer     Past Surgical History  Procedure Laterality Date  . Dilation and curettage of uterus    . Wisdom tooth extraction      she was in the 9th grade    OB History    Gravida Para Term Preterm AB TAB SAB Ectopic Multiple Living   0 1 0 1 0 0 5      No Known Allergies  Social History   Social History  . Marital Status: Married    Spouse Name: N/A  . Number of Children: 5  . Years of Education: N/A   Social History Main Topics  . Smoking status: Current Every Day Smoker -- 0.50 packs/day for 14 years    Types: Cigarettes  . Smokeless tobacco: Never Used  . Alcohol Use: No  . Drug Use: No  . Sexual Activity: Yes    Birth Control/ Protection: None, Abstinence   Other Topics Concern  . None   Social History Narrative    Family History  Problem Relation Age of Onset  . Hyperlipidemia Mother   . Hypertension Mother   . Ovarian cancer Mother 43  . Heart disease Father   . Kidney disease Father     kidney stones  .  Heart attack Father   . Cancer Paternal Grandmother   . Kidney cancer Maternal Aunt 50  . Cancer Maternal Grandmother 53    female organ cancer  . Colon cancer Other     MGMs sister dx in her 59s

## 2015-03-24 NOTE — Progress Notes (Signed)
Patient ID: [name], female   DOB: October 03, 1980, 35 y.o.   MRN: [id number] Expand All Collapse All    Preoperative History and Physical  Shatha Landgrebe is a 35 y.o. [id number] with Patient's last menstrual period was 03/04/2015. admitted for a laparoscopic bilateral salpingectomy for sterilization.    PMH:  Past Medical History  Diagnosis Date  . No pertinent past medical history   . Depression   . Pregnancy with other poor obstetric history(V23.49)   . Tobacco use disorder   . Emotional lability   . Lumbago   . Abnormal Pap smear   . Family history of ovarian cancer   . Family history of kidney cancer     PSH:  Past Surgical History  Procedure Laterality Date  . Dilation and curettage of uterus    . Wisdom tooth extraction      she was in the 9th grade    POb/GynH:  OB History    Gravida Para Term Preterm AB TAB SAB Ectopic Multiple Living   0 1 0 1 0 0 5      SH:  Social History  Substance Use Topics  . Smoking status: Current Every Day Smoker -- 0.50 packs/day for 14 years    Types: Cigarettes  . Smokeless tobacco: Never Used  . Alcohol Use: No    FH:  Family History  Problem Relation Age of Onset  . Hyperlipidemia Mother   . Hypertension Mother   . Ovarian cancer Mother 26  . Heart disease Father   . Kidney disease Father     kidney stones  . Heart attack Father   . Cancer Paternal Grandmother   . Kidney cancer Maternal Aunt 50  . Cancer Maternal Grandmother 81    female organ cancer  . Colon cancer Other     MGMs sister dx in her 42s     Allergies: No Known Allergies  Medications:  Current facility-administered medications:  . bupivacaine liposome (EXPAREL) 1.3 % injection 266 mg, 20 mL, Infiltration, Once, Lazaro Arms, MD . ceFAZolin (ANCEF) IVPB 2 g/50 mL premix, 2 g,  Intravenous, On Call to OR, Lazaro Arms, MD . ketorolac (TORADOL) 30 MG/ML injection 30 mg, 30 mg, Intravenous, Once, Lazaro Arms, MD . lactated ringers infusion, , Intravenous, Continuous, Benita Stabile, MD . midazolam (VERSED) injection 1-2 mg, 1-2 mg, Intravenous, Q5 min PRN, Benita Stabile, MD  Review of Systems:   Review of Systems  Constitutional: Negative for fever, chills, weight loss, malaise/fatigue and diaphoresis.  HENT: Negative for hearing loss, ear pain, nosebleeds, congestion, sore throat, neck pain, tinnitus and ear discharge.  Eyes: Negative for blurred vision, double vision, photophobia, pain, discharge and redness.  Respiratory: Negative for cough, hemoptysis, sputum production, shortness of breath, wheezing and stridor.  Cardiovascular: Negative for chest pain, palpitations, orthopnea, claudication, leg swelling and PND.  Gastrointestinal: Positive for abdominal pain. Negative for heartburn, nausea, vomiting, diarrhea, constipation, blood in stool and melena.  Genitourinary: Negative for dysuria, urgency, frequency, hematuria and flank pain.  Musculoskeletal: Negative for myalgias, back pain, joint pain and falls.  Skin: Negative for itching and rash.  Neurological: Negative for dizziness, tingling, tremors, sensory change, speech change, focal weakness, seizures, loss of consciousness, weakness and headaches.  Endo/Heme/Allergies: Negative for environmental allergies and polydipsia. Does not bruise/bleed easily.  Psychiatric/Behavioral: Negative for depression, suicidal ideas, hallucinations, memory loss and substance abuse. The patient is not nervous/anxious and does not have insomnia.  PHYSICAL EXAM:  Last menstrual period 03/04/2015, unknown if currently breastfeeding.   Vitals reviewed. Constitutional: She is oriented to person, place, and time. She appears well-developed and well-nourished.  HENT:  Head: Normocephalic and atraumatic.   Right Ear: External ear normal.  Left Ear: External ear normal.  Nose: Nose normal.  Mouth/Throat: Oropharynx is clear and moist.  Eyes: Conjunctivae and EOM are normal. Pupils are equal, round, and reactive to light. Right eye exhibits no discharge. Left eye exhibits no discharge. No scleral icterus.  Neck: Normal range of motion. Neck supple. No tracheal deviation present. No thyromegaly present.  Cardiovascular: Normal rate, regular rhythm, normal heart sounds and intact distal pulses. Exam reveals no gallop and no friction rub.  No murmur heard. Respiratory: Effort normal and breath sounds normal. No respiratory distress. She has no wheezes. She has no rales. She exhibits no tenderness.  GI: Soft. Bowel sounds are normal. She exhibits no distension and no mass. There is tenderness. There is no rebound and no guarding.  Genitourinary:   Vulva is normal without lesions Vagina is pink moist without discharge Cervix normal in appearance and pap is normal Uterus is normal size, contour, position, consistency, mobility, non-tender Adnexa is negative with normal sized ovaries by sonogram  Musculoskeletal: Normal range of motion. She exhibits no edema and no tenderness.  Neurological: She is alert and oriented to person, place, and time. She has normal reflexes. She displays normal reflexes. No cranial nerve deficit. She exhibits normal muscle tone. Coordination normal.  Skin: Skin is warm and dry. No rash noted. No erythema. No pallor.  Psychiatric: She has a normal mood and affect. Her behavior is normal. Judgment and thought content normal.    Labs: Results for orders placed or performed during the hospital encounter of 03/18/15 (from the past 336 hour(s))  Urinalysis, Routine w reflex microscopic (not at [facility])   Collection Time: 03/18/15 9:08 AM  Result Value Ref Range   Color, Urine YELLOW YELLOW   APPearance CLEAR CLEAR   Specific Gravity, Urine <1.005 (L)  1.005 - 1.030   pH 6.0 5.0 - 8.0   Glucose, UA NEGATIVE NEGATIVE mg/dL   Hgb urine dipstick NEGATIVE NEGATIVE   Bilirubin Urine NEGATIVE NEGATIVE   Ketones, ur NEGATIVE NEGATIVE mg/dL   Protein, ur NEGATIVE NEGATIVE mg/dL   Nitrite NEGATIVE NEGATIVE   Leukocytes, UA NEGATIVE NEGATIVE  CBC   Collection Time: 03/18/15 9:15 AM  Result Value Ref Range   WBC 9.5 4.0 - 10.5 K/uL   RBC 4.88 3.87 - 5.11 MIL/uL   Hemoglobin 15.1 (H) 12.0 - 15.0 g/dL   HCT 16.1 09.6 - 04.5 %   MCV 91.4 78.0 - 100.0 fL   MCH 30.9 26.0 - 34.0 pg   MCHC 33.9 30.0 - 36.0 g/dL   RDW 40.9 81.1 - 91.4 %   Platelets 256 150 - 400 K/uL  Comprehensive metabolic panel   Collection Time: 03/18/15 9:15 AM  Result Value Ref Range   Sodium 137 135 - 145 mmol/L   Potassium 4.2 3.5 - 5.1 mmol/L   Chloride 104 101 - 111 mmol/L   CO2 24 22 - 32 mmol/L   Glucose, Bld 95 65 - 99 mg/dL   BUN 15 6 - 20 mg/dL   Creatinine, Ser 7.82 0.44 - 1.00 mg/dL   Calcium 9.6 8.9 - 95.6 mg/dL   Total Protein 7.0 6.5 - 8.1 g/dL   Albumin 4.3 3.5 - 5.0 g/dL   AST 15 15 - 41  U/L   ALT 12 (L) 14 - 54 U/L   Alkaline Phosphatase 61 38 - 126 U/L   Total Bilirubin 0.8 0.3 - 1.2 mg/dL   GFR calc non Af Amer >60 >60 mL/min   GFR calc Af Amer >60 >60 mL/min   Anion gap 9 5 - 15  hCG, quantitative, pregnancy   Collection Time: 03/18/15 9:15 AM  Result Value Ref Range   hCG, Beta Chain, Quant, S <1 <5 mIU/mL    EKG: No orders found for this or any previous visit.  Imaging Studies:  Imaging Results    US Transvaginal Non-ob  02/21/2015 GYNECOLOGIC SONOGRAM Brittony Imhof is a 35 y.o. [id number] LMP 01/31/2015 for a pelvic sonogram for family HX of ovarian cancer. Uterus 9.9 x 6.8 x 4.9 cm, homogenous anteverted uterus Endometrium 4.6 mm, symmetrical, wnl  Right ovary 3.3 x 3.4 x 2.1 cm, wnl Left ovary 2.7 x 3.4 x 1.7 cm, wnl No free fluid seen Technician Comments: PELVIC US TA/TV: homogenous anteverted uterus,EEC 4.50mm,normal ov's bilat(mobile),no free fluid,no pain during ultrasound [employer] 02/21/2015 9:02 AM Clinical Impression and recommendations: I have reviewed the sonogram results above, combined with the patient's current clinical course, below are my impressions and any appropriate recommendations for management based on the sonographic findings. Normal uterus endometrium Specifically normal ovaries as it relates to CA 125 Brogan England H 02/21/2015 10:49 AM   US Pelvis Complete  02/21/2015 GYNECOLOGIC SONOGRAM Vivika Poythress is a 35 y.o. [id number] LMP 01/31/2015 for a pelvic sonogram for family HX of ovarian cancer. Uterus 9.9 x 6.8 x 4.9 cm, homogenous anteverted uterus Endometrium 4.6 mm, symmetrical, wnl Right ovary 3.3 x 3.4 x 2.1 cm, wnl Left ovary 2.7 x 3.4 x 1.7 cm, wnl No free fluid seen Technician Comments: PELVIC US TA/TV: homogenous anteverted uterus,EEC 4.6mm,normal ov's bilat(mobile),no free fluid,no pain during ultrasound [employer] 02/21/2015 9:02 AM Clinical Impression and recommendations: I have reviewed the sonogram results above, combined with the patient's current clinical course, below are my impressions and any appropriate recommendations for management based on the sonographic findings. Normal uterus endometrium Specifically normal ovaries as it relates to CA 125 Yordin Rhoda H 02/21/2015 10:49 AM       Assessment: Multiparous female desires permanent sterilization Desires bilateral salpingectomy for ovarian cancer prophylaxis   Patient Active Problem List   Diagnosis Date Noted  . Genetic testing 02/19/2015  . Family history of ovarian cancer   . Family history of kidney cancer   . Postpartum care following vaginal delivery  (4/2) 05/19/2011    Plan: Laparoscopic bilateral salpingectomy for sterilization

## 2015-03-25 ENCOUNTER — Encounter (HOSPITAL_COMMUNITY): Payer: Self-pay | Admitting: Obstetrics & Gynecology

## 2015-04-01 ENCOUNTER — Ambulatory Visit (INDEPENDENT_AMBULATORY_CARE_PROVIDER_SITE_OTHER): Payer: PRIVATE HEALTH INSURANCE | Admitting: Obstetrics & Gynecology

## 2015-04-01 ENCOUNTER — Encounter: Payer: Self-pay | Admitting: Obstetrics & Gynecology

## 2015-04-01 VITALS — BP 124/72 | HR 64 | Wt 179.0 lb

## 2015-04-01 DIAGNOSIS — Z9889 Other specified postprocedural states: Secondary | ICD-10-CM

## 2015-04-01 NOTE — Progress Notes (Signed)
Patient ID: [name], female   DOB: 1980/03/06, 35 y.o.   MRN: [id number] Post op laparoscpic bilateral salpingectomy  No complaints or complications  Incision x 3 all look good Staples removed  Follow up prn or for yearly

## 2016-03-16 DIAGNOSIS — Z6831 Body mass index (BMI) 31.0-31.9, adult: Secondary | ICD-10-CM | POA: Diagnosis not present

## 2016-03-16 DIAGNOSIS — E669 Obesity, unspecified: Secondary | ICD-10-CM | POA: Diagnosis not present

## 2016-04-13 DIAGNOSIS — Z6831 Body mass index (BMI) 31.0-31.9, adult: Secondary | ICD-10-CM | POA: Diagnosis not present

## 2016-04-13 DIAGNOSIS — E669 Obesity, unspecified: Secondary | ICD-10-CM | POA: Diagnosis not present

## 2016-08-26 DIAGNOSIS — E669 Obesity, unspecified: Secondary | ICD-10-CM | POA: Diagnosis not present

## 2016-10-16 DIAGNOSIS — J019 Acute sinusitis, unspecified: Secondary | ICD-10-CM | POA: Diagnosis not present

## 2016-10-16 DIAGNOSIS — J209 Acute bronchitis, unspecified: Secondary | ICD-10-CM | POA: Diagnosis not present

## 2016-10-16 DIAGNOSIS — H6691 Otitis media, unspecified, right ear: Secondary | ICD-10-CM | POA: Diagnosis not present

## 2016-10-16 DIAGNOSIS — Z6825 Body mass index (BMI) 25.0-25.9, adult: Secondary | ICD-10-CM | POA: Diagnosis not present

## 2016-12-10 DIAGNOSIS — F5081 Binge eating disorder: Secondary | ICD-10-CM | POA: Diagnosis not present

## 2017-02-25 DIAGNOSIS — F5081 Binge eating disorder: Secondary | ICD-10-CM | POA: Diagnosis not present

## 2017-02-25 DIAGNOSIS — E6609 Other obesity due to excess calories: Secondary | ICD-10-CM | POA: Diagnosis not present

## 2017-02-25 DIAGNOSIS — Z01419 Encounter for gynecological examination (general) (routine) without abnormal findings: Secondary | ICD-10-CM | POA: Diagnosis not present

## 2017-11-15 DIAGNOSIS — Z6828 Body mass index (BMI) 28.0-28.9, adult: Secondary | ICD-10-CM | POA: Diagnosis not present

## 2017-11-15 DIAGNOSIS — Z23 Encounter for immunization: Secondary | ICD-10-CM | POA: Diagnosis not present

## 2017-11-15 DIAGNOSIS — R300 Dysuria: Secondary | ICD-10-CM | POA: Diagnosis not present

## 2017-11-15 DIAGNOSIS — E782 Mixed hyperlipidemia: Secondary | ICD-10-CM | POA: Diagnosis not present

## 2017-11-15 DIAGNOSIS — Z0000 Encounter for general adult medical examination without abnormal findings: Secondary | ICD-10-CM | POA: Diagnosis not present

## 2017-12-20 DIAGNOSIS — F5081 Binge eating disorder: Secondary | ICD-10-CM | POA: Diagnosis not present

## 2017-12-20 DIAGNOSIS — E6609 Other obesity due to excess calories: Secondary | ICD-10-CM | POA: Diagnosis not present

## 2017-12-20 DIAGNOSIS — N898 Other specified noninflammatory disorders of vagina: Secondary | ICD-10-CM | POA: Diagnosis not present

## 2017-12-27 DIAGNOSIS — Z20828 Contact with and (suspected) exposure to other viral communicable diseases: Secondary | ICD-10-CM | POA: Diagnosis not present

## 2018-02-24 DIAGNOSIS — E6609 Other obesity due to excess calories: Secondary | ICD-10-CM | POA: Diagnosis not present

## 2018-02-24 DIAGNOSIS — F5081 Binge eating disorder: Secondary | ICD-10-CM | POA: Diagnosis not present

## 2018-03-13 ENCOUNTER — Telehealth: Payer: 59 | Admitting: Physician Assistant

## 2018-03-13 DIAGNOSIS — J069 Acute upper respiratory infection, unspecified: Secondary | ICD-10-CM

## 2018-03-13 MED ORDER — BENZONATATE 100 MG PO CAPS: 100.0000 mg | ORAL_CAPSULE | Freq: Three times a day (TID) | ORAL | 0 refills | Status: AC | PRN

## 2018-03-13 NOTE — Progress Notes (Signed)
We are sorry you are not feeling well.  Here is how we plan to help!  Based on what you have shared with me, it looks like you may have a viral upper respiratory infection or a "common cold".  Colds are caused by a large number of viruses; however, rhinovirus is the most common cause.   Symptoms of the common cold vary from person to person, with common symptoms including sore throat, cough, and malaise.  A low-grade fever of 100.4 may present, but is often uncommon.  Symptoms vary however, and are closely related to a person's age or underlying illnesses.  The most common symptoms associated with the common cold are nasal discharge or congestion, cough, sneezing, headache and pressure in the ears and face.  Cold symptoms usually persist for about 3 to 10 days, but can last up to 2 weeks.  It is important to know that colds do not cause serious illness or complications in most cases.    The common cold is transmitted from person to person, with the most common method of transmission being a person's hands.  The virus is able to live on the skin and can infect other persons for up to 2 hours after direct contact.  Also, colds are transmitted when someone coughs or sneezes; thus, it is important to cover the mouth to reduce this risk.  To keep the spread of the common cold at bay, good hand hygiene is very important.  This is an infection that is most likely caused by a virus. There are no specific treatments for the common cold other than to help you with the symptoms until the infection runs its course.    For nasal congestion, you may use an oral decongestants such as Mucinex D or if you have glaucoma or high blood pressure use plain Mucinex.  Saline nasal spray or nasal drops can help and can safely be used as often as needed for congestion.    If you do not have a history of heart disease, hypertension, diabetes or thyroid disease, prostate/bladder issues or glaucoma, you may also use Sudafed to treat  nasal congestion.  It is highly recommended that you consult with a pharmacist or your primary care physician to ensure this medication is safe for you to take.     If you have a cough, you may use cough suppressants such as Delsym and Robitussin.  If you have glaucoma or high blood pressure, you can also use Coricidin HBP.   For cough I have prescribed for you A prescription cough medication called Tessalon Perles 100 mg. You may take 1-2 capsules every 8 hours as needed for cough  If you have a sore or scratchy throat, use a saltwater gargle-  to  teaspoon of salt dissolved in a 4-ounce to 8-ounce glass of warm water.  Gargle the solution for approximately 15-30 seconds and then spit.  It is important not to swallow the solution.  You can also use throat lozenges/cough drops and Chloraseptic spray to help with throat pain or discomfort.  Warm or cold liquids can also be helpful in relieving throat pain.  For headache, pain or general discomfort, you can use Ibuprofen or Tylenol as directed.   Some authorities believe that zinc sprays or the use of Echinacea may shorten the course of your symptoms.   HOME CARE . Only take medications as instructed by your medical team. . Be sure to drink plenty of fluids. Water is fine as well as   fruit juices, sodas and electrolyte beverages. You may want to stay away from caffeine or alcohol. If you are nauseated, try taking small sips of liquids. How do you know if you are getting enough fluid? Your urine should be a pale yellow or almost colorless. . Get rest. . Taking a steamy shower or using a humidifier may help nasal congestion and ease sore throat pain. You can place a towel over your head and breathe in the steam from hot water coming from a faucet. . Using a saline nasal spray works much the same way. . Cough drops, hard candies and sore throat lozenges may ease your cough. . Avoid close contacts especially the very young and the elderly . Cover your  mouth if you cough or sneeze . Always remember to wash your hands.   GET HELP RIGHT AWAY IF: . You develop worsening fever. . If your symptoms do not improve within 10 days . You develop yellow or green discharge from your nose over 3 days. . You have coughing fits . You develop a severe head ache or visual changes. . You develop shortness of breath, difficulty breathing or start having chest pain . Your symptoms persist after you have completed your treatment plan  MAKE SURE YOU   Understand these instructions.  Will watch your condition.  Will get help right away if you are not doing well or get worse.  Your e-visit answers were reviewed by a board certified advanced clinical practitioner to complete your personal care plan. Depending upon the condition, your plan could have included both over the counter or prescription medications. Please review your pharmacy choice. If there is a problem, you may call our nursing hot line at and have the prescription routed to another pharmacy. Your safety is important to us. If you have drug allergies check your prescription carefully.   You can use MyChart to ask questions about today's visit, request a non-urgent call back, or ask for a work or school excuse for 24 hours related to this e-Visit. If it has been greater than 24 hours you will need to follow up with your provider, or enter a new e-Visit to address those concerns. You will get an e-mail in the next two days asking about your experience.  I hope that your e-visit has been valuable and will speed your recovery. Thank you for using e-visits.       

## 2018-03-29 DIAGNOSIS — E6609 Other obesity due to excess calories: Secondary | ICD-10-CM | POA: Diagnosis not present

## 2018-03-29 DIAGNOSIS — F5081 Binge eating disorder: Secondary | ICD-10-CM | POA: Diagnosis not present

## 2018-04-12 DIAGNOSIS — J111 Influenza due to unidentified influenza virus with other respiratory manifestations: Secondary | ICD-10-CM | POA: Diagnosis not present

## 2018-04-12 DIAGNOSIS — Z6829 Body mass index (BMI) 29.0-29.9, adult: Secondary | ICD-10-CM | POA: Diagnosis not present

## 2018-04-12 DIAGNOSIS — H66001 Acute suppurative otitis media without spontaneous rupture of ear drum, right ear: Secondary | ICD-10-CM | POA: Diagnosis not present

## 2018-04-13 ENCOUNTER — Emergency Department (HOSPITAL_COMMUNITY)
Admission: EM | Admit: 2018-04-13 | Discharge: 2018-04-13 | Disposition: A | Discharge status: Home / Self Care | Payer: 59 | Attending: Emergency Medicine | Admitting: Emergency Medicine

## 2018-04-13 ENCOUNTER — Encounter (HOSPITAL_COMMUNITY): Payer: Self-pay

## 2018-04-13 ENCOUNTER — Other Ambulatory Visit: Payer: Self-pay

## 2018-04-13 DIAGNOSIS — M791 Myalgia, unspecified site: Secondary | ICD-10-CM | POA: Diagnosis present

## 2018-04-13 DIAGNOSIS — F17210 Nicotine dependence, cigarettes, uncomplicated: Secondary | ICD-10-CM | POA: Diagnosis not present

## 2018-04-13 DIAGNOSIS — J111 Influenza due to unidentified influenza virus with other respiratory manifestations: Secondary | ICD-10-CM | POA: Diagnosis not present

## 2018-04-13 DIAGNOSIS — R519 Headache, unspecified: Secondary | ICD-10-CM

## 2018-04-13 DIAGNOSIS — R51 Headache: Secondary | ICD-10-CM | POA: Insufficient documentation

## 2018-04-13 DIAGNOSIS — R509 Fever, unspecified: Secondary | ICD-10-CM | POA: Diagnosis not present

## 2018-04-13 MED ORDER — PROCHLORPERAZINE EDISYLATE 10 MG/2ML IJ SOLN
5.0000 mg | Freq: Once | INTRAMUSCULAR | Status: AC
  Administered 2018-04-13: 5 mg via INTRAVENOUS
  Filled 2018-04-13: qty 2

## 2018-04-13 MED ORDER — TRAMADOL HCL 50 MG PO TABS: 50.0000 mg | ORAL_TABLET | Freq: Four times a day (QID) | ORAL | 0 refills | Status: AC | PRN

## 2018-04-13 MED ORDER — ONDANSETRON HCL 4 MG/2ML IJ SOLN
4.0000 mg | Freq: Once | INTRAMUSCULAR | Status: AC
  Administered 2018-04-13: 4 mg via INTRAVENOUS
  Filled 2018-04-13: qty 2

## 2018-04-13 MED ORDER — DIPHENHYDRAMINE HCL 50 MG/ML IJ SOLN
12.5000 mg | Freq: Once | INTRAMUSCULAR | Status: AC
  Administered 2018-04-13: 12.5 mg via INTRAVENOUS
  Filled 2018-04-13: qty 1

## 2018-04-13 MED ORDER — KETOROLAC TROMETHAMINE 30 MG/ML IJ SOLN
30.0000 mg | Freq: Once | INTRAMUSCULAR | Status: AC
  Administered 2018-04-13: 30 mg via INTRAVENOUS
  Filled 2018-04-13: qty 1

## 2018-04-13 MED ORDER — DEXAMETHASONE SODIUM PHOSPHATE 10 MG/ML IJ SOLN
10.0000 mg | Freq: Once | INTRAMUSCULAR | Status: AC
  Administered 2018-04-13: 10 mg via INTRAVENOUS
  Filled 2018-04-13: qty 1

## 2018-04-13 MED ORDER — SODIUM CHLORIDE 0.9 % IV SOLN
1000.0000 mL | INTRAVENOUS | Status: DC
  Administered 2018-04-13: 1000 mL via INTRAVENOUS

## 2018-04-13 MED ORDER — PSEUDOEPHEDRINE HCL 60 MG PO TABS
60.0000 mg | ORAL_TABLET | Freq: Once | ORAL | Status: AC
  Administered 2018-04-13: 60 mg via ORAL
  Filled 2018-04-13: qty 1

## 2018-04-13 MED ORDER — SODIUM CHLORIDE 0.9 % IV BOLUS (SEPSIS)
1000.0000 mL | Freq: Once | INTRAVENOUS | Status: AC
  Administered 2018-04-13: 1000 mL via INTRAVENOUS

## 2018-04-13 NOTE — Discharge Instructions (Addendum)
Please increase fluids.  Please wash hands frequently.  Your neurologic examination is well within normal limits.  Please continue to use the medication as previously ordered for your flu.  Continue to use Tylenol every 4 hours or ibuprofen every 6 hours for fever, and or mild to moderate pain.  May use Ultram for headache.  This medication may cause drowsiness, and/or lightheadedness.  Please do not drive a vehicle, operate machinery, drink alcohol, or participate in activities requiring concentration when taking this medication.  Please see your primary physician or return to the emergency department if you develop any headache that is not responsive to your current medications, high fever, visual changes, difficulty with using extremities, excessive nausea vomiting, changes in your condition, problems or concerns.

## 2018-04-13 NOTE — ED Triage Notes (Signed)
Pt was diagnosed with the flu, strep throat, and an ear infection yesterday. Was given Amoxicillin and Tamiflu and is up to date on all doses. States her fever still spikes. Last took 1000mg  of Tylenol 2 hours ago. No fever today. Diarrhea as well. Hasn't been able to eat in 2 days.

## 2018-04-13 NOTE — ED Notes (Signed)
Generalized pain all over  

## 2018-04-13 NOTE — ED Provider Notes (Signed)
[facility] EMERGENCY DEPARTMENT Provider Note   CSN: [id number] Arrival date & time: 04/13/18  0809    History   Chief Complaint Chief Complaint  Patient presents with  . Influenza    HPI [name] is a 38 y.o. female.     Patient is a 38 year old female who presents to the emergency department with a complaint of generalized pain and influenza.  The patient states that she was diagnosed on yesterday February 25 with influenza, strep throat, and an ear infection.  She was placed on Tamiflu.  She was also treated with Amoxil.  The patient states that she is having tremendous headaches.  She is using Tylenol and ibuprofen, but states this does not seem to be helping.  She is not having any vision changes, no problems with her balance, no problems with her speech, and no problems with her gait.  There is been no recent injury or trauma to the head.  She presents at this time requesting assistance with her headache.  The patient is also concerned she has not been eating over the past 2 days, and is been drinking very little.  The history is provided by the patient.    Past Medical History:  Diagnosis Date  . Abnormal Pap smear   . Depression   . Emotional lability   . Family history of kidney cancer   . Family history of ovarian cancer   . Lumbago   . No pertinent past medical history   . Pregnancy with other poor obstetric history(V23.49)   . Tobacco use disorder     Patient Active Problem List   Diagnosis Date Noted  . Genetic testing 02/19/2015  . Family history of ovarian cancer   . Family history of kidney cancer   . Postpartum care following vaginal delivery (4/2) 05/19/2011    Past Surgical History:  Procedure Laterality Date  . DILATION AND CURETTAGE OF UTERUS    . LAPAROSCOPIC BILATERAL SALPINGECTOMY Bilateral 03/22/2015   Procedure: LAPAROSCOPIC BILATERAL SALPINGECTOMY;  Surgeon: Lazaro Arms, MD;  Location: [facility];  Service: Gynecology;  Laterality:  Bilateral;  . WISDOM TOOTH EXTRACTION     she was in the 9th grade     OB History    Gravida  6   Para  5   Term  5   Preterm  0   AB  1   Living  5     SAB  1   TAB  0   Ectopic  0   Multiple  0   Live Births  5            Home Medications    Prior to Admission medications   Medication Sig Start Date End Date Taking? Authorizing Provider  benzonatate (TESSALON) 100 MG capsule Take 1 capsule (100 mg total) by mouth 3 (three) times daily as needed for cough. 03/13/18   Waldon Merl, PA-C  HYDROcodone-acetaminophen (NORCO/VICODIN) 5-325 MG tablet Take 1 tablet by mouth every 6 (six) hours as needed. Patient not taking: Reported on 04/01/2015 03/22/15   Lazaro Arms, MD  ketorolac (TORADOL) 10 MG tablet Take 1 tablet (10 mg total) by mouth every 8 (eight) hours as needed. Patient not taking: Reported on 04/01/2015 03/22/15   Lazaro Arms, MD  ondansetron (ZOFRAN) 8 MG tablet Take 1 tablet (8 mg total) by mouth every 8 (eight) hours as needed for nausea. Patient not taking: Reported on 04/01/2015 03/22/15   Lazaro Arms, MD  Family History Family History  Problem Relation Age of Onset  . Hyperlipidemia Mother   . Hypertension Mother   . Ovarian cancer Mother 44  . Heart disease Father   . Kidney disease Father        kidney stones  . Heart attack Father   . Cancer Paternal Grandmother   . Kidney cancer Maternal Aunt 50  . Cancer Maternal Grandmother 81       female organ cancer  . Colon cancer Other        MGMs sister dx in her 101s    Social History Social History   Tobacco Use  . Smoking status: Current Every Day Smoker    Packs/day: 0.50    Years: 14.00    Pack years: 7.00    Types: Cigarettes  . Smokeless tobacco: Never Used  Substance Use Topics  . Alcohol use: No  . Drug use: No     Allergies   Patient has no known allergies.   Review of Systems Review of Systems  Constitutional: Positive for activity change, appetite change,  chills and fever.       All ROS Neg except as noted in HPI  HENT: Positive for congestion, postnasal drip, sinus pressure and sore throat. Negative for nosebleeds.   Eyes: Negative for photophobia and discharge.  Respiratory: Negative for cough, shortness of breath and wheezing.   Cardiovascular: Negative for chest pain and palpitations.  Gastrointestinal: Negative for abdominal pain and blood in stool.  Genitourinary: Negative for dysuria, frequency and hematuria.  Musculoskeletal: Positive for myalgias. Negative for arthralgias, back pain and neck pain.  Skin: Negative.   Neurological: Positive for headaches. Negative for dizziness, seizures and speech difficulty.  Psychiatric/Behavioral: Negative for confusion and hallucinations.     Physical Exam Updated Vital Signs BP 112/76 (BP Location: Right Arm)   Pulse (!) 108   Temp 98.6 F (37 C) (Oral)   Resp 15   Ht 5\' 3"  (1.6 m)   Wt 72.6 kg   LMP 04/03/2018   SpO2 97%   BMI 28.34 kg/m   Physical Exam Vitals signs and nursing note reviewed.  Constitutional:      General: She is not in acute distress.    Appearance: She is well-developed.  HENT:     Head: Normocephalic and atraumatic.     Right Ear: External ear normal.     Left Ear: External ear normal.     Nose: Congestion present.     Mouth/Throat:     Pharynx: Posterior oropharyngeal erythema present.  Eyes:     General: No scleral icterus.       Right eye: No discharge.        Left eye: No discharge.     Conjunctiva/sclera: Conjunctivae normal.  Neck:     Musculoskeletal: Neck supple.     Trachea: No tracheal deviation.  Cardiovascular:     Rate and Rhythm: Normal rate and regular rhythm.  Pulmonary:     Effort: Pulmonary effort is normal. No respiratory distress.     Breath sounds: Normal breath sounds. No stridor. No wheezing or rales.  Abdominal:     General: Bowel sounds are normal. There is no distension.     Palpations: Abdomen is soft.     Tenderness:  There is no abdominal tenderness. There is no guarding or rebound.  Musculoskeletal:        General: No tenderness.     Comments: No hot joints appreciated.  Skin:  General: Skin is warm and dry.     Findings: No rash.  Neurological:     General: No focal deficit present.     Mental Status: She is alert and oriented to person, place, and time.     Cranial Nerves: No cranial nerve deficit (no facial droop, extraocular movements intact, no slurred speech).     Sensory: No sensory deficit.     Motor: No weakness, abnormal muscle tone or seizure activity.     Coordination: Coordination normal.     Gait: Gait normal.      ED Treatments / Results  Labs (all labs ordered are listed, but only abnormal results are displayed) Labs Reviewed - No data to display  EKG None  Radiology No results found.  Procedures Procedures (including critical care time)  Medications Ordered in ED Medications - No data to display   Initial Impression / Assessment and Plan / ED Course  I have reviewed the triage vital signs and the nursing notes.  Pertinent labs & imaging results that were available during my care of the patient were reviewed by me and considered in my medical decision making (see chart for details).          Final Clinical Impressions(s) / ED Diagnoses    Patient is given IV fluids.  She is also given IV Toradol to assist with her headache.  Patient does not have a driver at this time.  Nurse reports that the patient states that the pain is no better.  She is going to obtain a driver and is requesting additional medication for pain.  Headache cocktail with IV Benadryl, Compazine, and Decadron given to the patient. Recheck.  Patient states that she feels tremendously better.  No new gross neurologic deficits appreciated on reexamination.  Feel that it is safe for the patient to be discharged home.  Patient to follow-up with the primary physician, or return to the  emergency department if any changes in condition, problems, or concerns.   Final diagnoses:  Influenza  Bad headache    ED Discharge Orders         Ordered    traMADol (ULTRAM) 50 MG tablet  Every 6 hours PRN     04/13/18 1230           Ivery QualeBryant, Chioke Noxon, PA-C 04/14/18 1147    Samuel JesterMcManus, Kathleen, DO 04/15/18 [phone]

## 2018-06-27 DIAGNOSIS — F5081 Binge eating disorder: Secondary | ICD-10-CM | POA: Diagnosis not present

## 2018-06-27 DIAGNOSIS — E6609 Other obesity due to excess calories: Secondary | ICD-10-CM | POA: Diagnosis not present

## 2018-12-14 ENCOUNTER — Other Ambulatory Visit: Payer: Self-pay

## 2018-12-14 DIAGNOSIS — Z20822 Contact with and (suspected) exposure to covid-19: Secondary | ICD-10-CM

## 2018-12-16 LAB — NOVEL CORONAVIRUS, NAA: SARS-CoV-2, NAA: NOT DETECTED

## 2019-03-09 ENCOUNTER — Other Ambulatory Visit: Payer: 59

## 2019-03-13 DIAGNOSIS — Z20828 Contact with and (suspected) exposure to other viral communicable diseases: Secondary | ICD-10-CM | POA: Diagnosis not present

## 2019-07-07 ENCOUNTER — Ambulatory Visit: Payer: Self-pay | Attending: Internal Medicine

## 2019-07-07 DIAGNOSIS — Z23 Encounter for immunization: Secondary | ICD-10-CM

## 2019-07-07 NOTE — Progress Notes (Signed)
   Covid-19 Vaccination Clinic  Name:  [name]    MRN: [id number] DOB: [birth year]  07/07/2019  [name] was observed post Covid-19 immunization for 15 minutes without incident. She was provided with Vaccine Information Sheet and instruction to access the V-Safe system.   [name] was instructed to call 911 with any severe reactions post vaccine: Marland Kitchen Difficulty breathing  . Swelling of face and throat  . A fast heartbeat  . A bad rash all over body  . Dizziness and weakness   Immunizations Administered    Name Date Dose VIS Date Route   Pfizer COVID-19 Vaccine 07/07/2019  3:27 PM 0.3 mL 04/12/2018 Intramuscular   Manufacturer: [employer], [employer]   Lot: [id number]   NDC: [address]-3

## 2019-07-28 ENCOUNTER — Ambulatory Visit: Payer: Self-pay | Attending: Internal Medicine

## 2019-12-26 DIAGNOSIS — R635 Abnormal weight gain: Secondary | ICD-10-CM | POA: Diagnosis not present

## 2019-12-26 DIAGNOSIS — Z0000 Encounter for general adult medical examination without abnormal findings: Secondary | ICD-10-CM | POA: Diagnosis not present

## 2019-12-26 DIAGNOSIS — Z6833 Body mass index (BMI) 33.0-33.9, adult: Secondary | ICD-10-CM | POA: Diagnosis not present

## 2019-12-26 DIAGNOSIS — E782 Mixed hyperlipidemia: Secondary | ICD-10-CM | POA: Diagnosis not present

## 2020-02-10 DIAGNOSIS — M62838 Other muscle spasm: Secondary | ICD-10-CM | POA: Diagnosis not present

## 2020-02-10 DIAGNOSIS — M25512 Pain in left shoulder: Secondary | ICD-10-CM | POA: Diagnosis not present

## 2020-02-11 DIAGNOSIS — E785 Hyperlipidemia, unspecified: Secondary | ICD-10-CM | POA: Diagnosis not present

## 2020-02-11 DIAGNOSIS — M546 Pain in thoracic spine: Secondary | ICD-10-CM | POA: Diagnosis not present

## 2020-02-11 DIAGNOSIS — R079 Chest pain, unspecified: Secondary | ICD-10-CM | POA: Diagnosis not present

## 2020-02-11 DIAGNOSIS — M25512 Pain in left shoulder: Secondary | ICD-10-CM | POA: Diagnosis not present

## 2020-02-11 DIAGNOSIS — M549 Dorsalgia, unspecified: Secondary | ICD-10-CM | POA: Diagnosis not present

## 2020-02-11 DIAGNOSIS — M62838 Other muscle spasm: Secondary | ICD-10-CM | POA: Diagnosis not present

## 2020-02-11 DIAGNOSIS — Z79899 Other long term (current) drug therapy: Secondary | ICD-10-CM | POA: Diagnosis not present

## 2020-02-16 DIAGNOSIS — Z20828 Contact with and (suspected) exposure to other viral communicable diseases: Secondary | ICD-10-CM | POA: Diagnosis not present

## 2020-02-16 DIAGNOSIS — J069 Acute upper respiratory infection, unspecified: Secondary | ICD-10-CM | POA: Diagnosis not present

## 2021-12-27 DIAGNOSIS — N971 Female infertility of tubal origin: Secondary | ICD-10-CM | POA: Diagnosis not present

## 2022-01-06 DIAGNOSIS — N971 Female infertility of tubal origin: Secondary | ICD-10-CM | POA: Diagnosis not present

## 2022-01-06 DIAGNOSIS — Z3183 Encounter for assisted reproductive fertility procedure cycle: Secondary | ICD-10-CM | POA: Diagnosis not present

## 2022-01-06 DIAGNOSIS — Z319 Encounter for procreative management, unspecified: Secondary | ICD-10-CM | POA: Diagnosis not present

## 2022-01-06 DIAGNOSIS — E559 Vitamin D deficiency, unspecified: Secondary | ICD-10-CM | POA: Diagnosis not present

## 2022-01-06 DIAGNOSIS — N979 Female infertility, unspecified: Secondary | ICD-10-CM | POA: Diagnosis not present

## 2022-01-13 DIAGNOSIS — Z01419 Encounter for gynecological examination (general) (routine) without abnormal findings: Secondary | ICD-10-CM | POA: Diagnosis not present

## 2022-01-13 DIAGNOSIS — Z124 Encounter for screening for malignant neoplasm of cervix: Secondary | ICD-10-CM | POA: Diagnosis not present

## 2022-01-13 DIAGNOSIS — Z113 Encounter for screening for infections with a predominantly sexual mode of transmission: Secondary | ICD-10-CM | POA: Diagnosis not present

## 2022-01-13 DIAGNOSIS — Z803 Family history of malignant neoplasm of breast: Secondary | ICD-10-CM | POA: Diagnosis not present

## 2022-01-13 DIAGNOSIS — Z1151 Encounter for screening for human papillomavirus (HPV): Secondary | ICD-10-CM | POA: Diagnosis not present

## 2022-01-13 DIAGNOSIS — Z6834 Body mass index (BMI) 34.0-34.9, adult: Secondary | ICD-10-CM | POA: Diagnosis not present

## 2022-01-28 DIAGNOSIS — H524 Presbyopia: Secondary | ICD-10-CM | POA: Diagnosis not present

## 2022-08-10 DIAGNOSIS — N959 Unspecified menopausal and perimenopausal disorder: Secondary | ICD-10-CM | POA: Diagnosis not present

## 2022-11-16 ENCOUNTER — Encounter (HOSPITAL_BASED_OUTPATIENT_CLINIC_OR_DEPARTMENT_OTHER): Payer: Self-pay | Admitting: Student

## 2022-11-16 ENCOUNTER — Ambulatory Visit (HOSPITAL_BASED_OUTPATIENT_CLINIC_OR_DEPARTMENT_OTHER): Payer: Commercial Managed Care - PPO | Admitting: Student

## 2022-11-16 DIAGNOSIS — M25511 Pain in right shoulder: Secondary | ICD-10-CM

## 2022-11-16 NOTE — Progress Notes (Unsigned)
     HPI: Patient is here today for an ultrasound-guided glenohumeral injection of the right shoulder per Dr. Steward Drone.  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  This has been restricting her ability to lift and perform certain motions.   Physical Exam: No evidence of erythema or warmth of the right shoulder.  Some ROM limitations and discomfort with internal and external rotation, but full motion with forward flexion.   Procedure Note  Patient: [name]             Date of Birth: 04/26/1980           MRN: [id number]             Visit Date: 11/16/2022  Procedures: Visit Diagnoses:  1. Acute pain of right shoulder     Large Joint Inj: R glenohumeral on 11/17/2022 2:35 PM Indications: pain Details: 22 G 1.5 in needle, ultrasound-guided anterior approach Medications: 4 mL lidocaine 1 %; 2 mL triamcinolone acetonide 40 MG/ML Outcome: tolerated well, no immediate complications Procedure, treatment alternatives, risks and benefits explained, specific risks discussed. Consent was given by the patient. Immediately prior to procedure a time out was called to verify the correct patient, procedure, equipment, support staff and site/side marked as required. Patient was prepped and draped in the usual sterile fashion.      Plan: Return to clinic as needed    I personally saw and evaluated the patient, and participated in the management and treatment plan.  Hazle Nordmann, PA-C Orthopedics

## 2022-11-17 DIAGNOSIS — M25511 Pain in right shoulder: Secondary | ICD-10-CM | POA: Diagnosis not present

## 2022-11-17 MED ORDER — LIDOCAINE HCL 1 % IJ SOLN
4.0000 mL | INTRAMUSCULAR | Status: AC | PRN
  Administered 2022-11-17: 4 mL

## 2022-11-17 MED ORDER — TRIAMCINOLONE ACETONIDE 40 MG/ML IJ SUSP
2.0000 mL | INTRAMUSCULAR | Status: AC | PRN
  Administered 2022-11-17: 2 mL via INTRA_ARTICULAR

## 2022-12-04 ENCOUNTER — Ambulatory Visit (HOSPITAL_BASED_OUTPATIENT_CLINIC_OR_DEPARTMENT_OTHER): Payer: Commercial Managed Care - PPO

## 2022-12-04 ENCOUNTER — Encounter (HOSPITAL_BASED_OUTPATIENT_CLINIC_OR_DEPARTMENT_OTHER): Payer: Self-pay | Admitting: Student

## 2022-12-04 ENCOUNTER — Ambulatory Visit (HOSPITAL_BASED_OUTPATIENT_CLINIC_OR_DEPARTMENT_OTHER): Payer: Commercial Managed Care - PPO | Admitting: Student

## 2022-12-04 DIAGNOSIS — M7711 Lateral epicondylitis, right elbow: Secondary | ICD-10-CM | POA: Diagnosis not present

## 2022-12-04 DIAGNOSIS — M25511 Pain in right shoulder: Secondary | ICD-10-CM

## 2022-12-04 DIAGNOSIS — M19011 Primary osteoarthritis, right shoulder: Secondary | ICD-10-CM | POA: Diagnosis not present

## 2022-12-04 NOTE — Progress Notes (Signed)
Chief Complaint: Right knee pain     History of Present Illness:    [name] is a 42 y.o. female presenting today with right shoulder pain.  She states that glenohumeral cortisone injection on 11/16/2022 gave her relief for approximately 1 week.  She has a constant, dull pain that she describes as deep in the shoulder.  This occasionally throbs.  Pain is worsened particularly from long shifts or certain activities at work.  She has been alternating ibuprofen and Tylenol for pain control.  Also states that she does have pain in the lateral elbow at times.  This however is less significant than her shoulder.   Surgical History:   None  PMH/PSH/Family History/Social History/Meds/Allergies:    Past Medical History:  Diagnosis Date   Abnormal Pap smear    Depression    Emotional lability    Family history of kidney cancer    Family history of ovarian cancer    Lumbago    No pertinent past medical history    Pregnancy with other poor obstetric history(V23.49)    Tobacco use disorder    Past Surgical History:  Procedure Laterality Date   DILATION AND CURETTAGE OF UTERUS     LAPAROSCOPIC BILATERAL SALPINGECTOMY Bilateral 03/22/2015   Procedure: LAPAROSCOPIC BILATERAL SALPINGECTOMY;  Surgeon: Lazaro Arms, MD;  Location: [facility];  Service: Gynecology;  Laterality: Bilateral;   WISDOM TOOTH EXTRACTION     she was in the 9th grade   Social History   Socioeconomic History   Marital status: Divorced    Spouse name: Not on file   Number of children: 5   Years of education: Not on file   Highest education level: Not on file  Occupational History   Not on file  Tobacco Use   Smoking status: Every Day    Current packs/day: 0.50    Average packs/day: 0.5 packs/day for 14.0 years (7.0 ttl pk-yrs)    Types: Cigarettes   Smokeless tobacco: Never  Substance and Sexual Activity   Alcohol use: No   Drug use: No   Sexual activity: Not Currently     Birth control/protection: None, Abstinence  Other Topics Concern   Not on file  Social History Narrative   Not on file   Social Determinants of Health   Financial Resource Strain: Not on file  Food Insecurity: Not on file  Transportation Needs: Not on file  Physical Activity: Not on file  Stress: Not on file  Social Connections: Unknown (07/01/2021)   Received from [facility]   Social Network    Social Network: Not on file   Family History  Problem Relation Age of Onset   Hyperlipidemia Mother    Hypertension Mother    Ovarian cancer Mother 5   Heart disease Father    Kidney disease Father        kidney stones   Heart attack Father    Cancer Paternal Grandmother    Kidney cancer Maternal Aunt 50   Cancer Maternal Grandmother 36       female organ cancer   Colon cancer Other        MGMs sister dx in her 48s   No Known Allergies Current Outpatient Medications  Medication Sig Dispense Refill   amoxicillin (AMOXIL) 500 MG capsule TAKE THREE  CAPSULES BY MOUTH TWICE DAILY     benzonatate (TESSALON) 100 MG capsule Take 1 capsule (100 mg total) by mouth 3 (three) times daily as needed for cough. (Patient not taking: Reported on 04/13/2018) 30 capsule 0   ibuprofen (ADVIL,MOTRIN) 200 MG tablet Take 800 mg by mouth every 6 (six) hours as needed.     oseltamivir (TAMIFLU) 75 MG capsule Take 75 mg by mouth 2 (two) times daily.     traMADol (ULTRAM) 50 MG tablet Take 1 tablet (50 mg total) by mouth every 6 (six) hours as needed. 12 tablet 0   No current facility-administered medications for this visit.   No results found.  Review of Systems:   A ROS was performed including pertinent positives and negatives as documented in the HPI.  Physical Exam :   Constitutional: NAD and appears stated age Neurological: Alert and oriented Psych: Appropriate affect and cooperative unknown if currently breastfeeding.   Comprehensive Musculoskeletal Exam:    Full active  range of motion of bilateral shoulders to 160 degrees forward flexion, 60 degrees external rotation, and internal rotation to L4.  Tenderness over the anterior glenohumeral joint and AC joint.  Positive O'Brien and cross-body adduction.  Negative empty can test.  Imaging:   Xray (right shoulder 3 views): Negative   I personally reviewed and interpreted the radiographs.   Assessment:   42 y.o. female with right shoulder pain.  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  Did not get long-lasting relief with glenohumeral cortisone injection.  Given her exam and location of pain it does appear to be some involvement of the [facility] joint, however I am also suspicious for an underlying chronic labral injury.  I would like to obtain an MRI at this point for further assessment.  She can continue current pain regimen with ibuprofen and Tylenol.  Will hold off on any repeat injections today until she is back for MRI review, at which time we can discuss further treatment plan.  Plan :    - Obtain right shoulder MRI and follow up to discuss results and treatment plan     I personally saw and evaluated the patient, and participated in the management and treatment plan.  Hazle Nordmann, PA-C Orthopedics

## 2023-01-21 ENCOUNTER — Ambulatory Visit (HOSPITAL_COMMUNITY)
Admission: RE | Admit: 2023-01-21 | Discharge: 2023-01-21 | Disposition: A | Discharge status: Home / Self Care | Payer: Commercial Managed Care - PPO | Source: Ambulatory Visit | Attending: Student | Admitting: Student

## 2023-01-21 DIAGNOSIS — M25511 Pain in right shoulder: Secondary | ICD-10-CM | POA: Insufficient documentation

## 2023-01-21 DIAGNOSIS — M12811 Other specific arthropathies, not elsewhere classified, right shoulder: Secondary | ICD-10-CM | POA: Diagnosis not present

## 2023-01-21 DIAGNOSIS — G8929 Other chronic pain: Secondary | ICD-10-CM | POA: Diagnosis not present

## 2023-01-22 ENCOUNTER — Ambulatory Visit (HOSPITAL_BASED_OUTPATIENT_CLINIC_OR_DEPARTMENT_OTHER): Payer: Commercial Managed Care - PPO | Admitting: Student

## 2023-01-22 ENCOUNTER — Ambulatory Visit (HOSPITAL_COMMUNITY): Payer: Commercial Managed Care - PPO

## 2023-01-22 ENCOUNTER — Encounter (HOSPITAL_BASED_OUTPATIENT_CLINIC_OR_DEPARTMENT_OTHER): Payer: Self-pay | Admitting: Student

## 2023-01-22 DIAGNOSIS — M7711 Lateral epicondylitis, right elbow: Secondary | ICD-10-CM | POA: Diagnosis not present

## 2023-01-22 DIAGNOSIS — M25521 Pain in right elbow: Secondary | ICD-10-CM | POA: Diagnosis not present

## 2023-01-22 MED ORDER — LIDOCAINE HCL 1 % IJ SOLN
2.0000 mL | INTRAMUSCULAR | Status: AC | PRN
  Administered 2023-01-22: 2 mL

## 2023-01-22 MED ORDER — TRIAMCINOLONE ACETONIDE 40 MG/ML IJ SUSP
2.0000 mL | INTRAMUSCULAR | Status: AC | PRN
Start: 2023-01-22 — End: 2023-01-22
  Administered 2023-01-22: 2 mL via INTRA_ARTICULAR

## 2023-01-22 NOTE — Progress Notes (Signed)
     HPI: Patient is here today for injection of the right lateral epicondyle.  We did discuss this at her last visit and she has been unable to get any relief.  This has been affecting her at work as well as with her normal activities.  She is right-hand dominant.   Physical Exam: Tenderness palpation directly over the right lateral epicondyle.  Pain worsened with active and resisted wrist extension.  No overlying erythema or warmth.   Procedure Note  Patient: [name]             Date of Birth: [birth year]           MRN: [id number]             Visit Date: 01/22/2023  Procedures: Visit Diagnoses:  1. Lateral epicondylitis of right elbow     Medium Joint Inj: R lateral epicondyle on 01/22/2023 6:01 PM Details: 25 G 1.5 in needle, ultrasound-guided lateral approach Medications: 2 mL lidocaine 1 %; 2 mL triamcinolone acetonide 40 MG/ML Outcome: tolerated well, no immediate complications Procedure, treatment alternatives, risks and benefits explained, specific risks discussed. Consent was given by the patient. Immediately prior to procedure a time out was called to verify the correct patient, procedure, equipment, support staff and site/side marked as required. Patient was prepped and draped in the usual sterile fashion.      Plan: Return to clinic as needed      Hazle Nordmann, PA-C Orthopedics

## 2023-02-26 ENCOUNTER — Ambulatory Visit (HOSPITAL_BASED_OUTPATIENT_CLINIC_OR_DEPARTMENT_OTHER): Payer: Commercial Managed Care - PPO | Admitting: Orthopaedic Surgery

## 2023-02-26 DIAGNOSIS — M19019 Primary osteoarthritis, unspecified shoulder: Secondary | ICD-10-CM

## 2023-02-26 DIAGNOSIS — M19011 Primary osteoarthritis, right shoulder: Secondary | ICD-10-CM

## 2023-02-26 DIAGNOSIS — M25511 Pain in right shoulder: Secondary | ICD-10-CM

## 2023-02-26 MED ORDER — TRIAMCINOLONE ACETONIDE 40 MG/ML IJ SUSP
80.0000 mg | INTRAMUSCULAR | Status: AC | PRN
  Administered 2023-02-26: 80 mg via INTRA_ARTICULAR

## 2023-02-26 MED ORDER — LIDOCAINE HCL 1 % IJ SOLN
4.0000 mL | INTRAMUSCULAR | Status: AC | PRN
Start: 2023-02-26 — End: 2023-02-26
  Administered 2023-02-26: 4 mL

## 2023-02-26 NOTE — Progress Notes (Addendum)
 Chief Complaint: Right shoulder pain        History of Present Illness:    02/26/2023: Presents today for follow-up of the right shoulder as well as MRI discussion   [name] is a 43 y.o. female presenting today with right shoulder pain.  She states that glenohumeral cortisone injection on 11/16/2022 gave her relief for approximately 1 week.  She has a constant, dull pain that she describes as deep in the shoulder.  This occasionally throbs.  Pain is worsened particularly from long shifts or certain activities at work.  She has been alternating ibuprofen  and Tylenol  for pain control.  Also states that she does have pain in the lateral elbow at times.  This however is less significant than her shoulder.     Surgical History:   None   PMH/PSH/Family History/Social History/Meds/Allergies:         Past Medical History:  Diagnosis Date   Abnormal Pap smear     Depression     Emotional lability     Family history of kidney cancer     Family history of ovarian cancer     Lumbago     No pertinent past medical history     Pregnancy with other poor obstetric history(V23.49)     Tobacco use disorder               Past Surgical History:  Procedure Laterality Date   DILATION AND CURETTAGE OF UTERUS       LAPAROSCOPIC BILATERAL SALPINGECTOMY Bilateral 03/22/2015    Procedure: LAPAROSCOPIC BILATERAL SALPINGECTOMY;  Surgeon: Vonn VEAR Inch, MD;  Location: [facility];  Service: Gynecology;  Laterality: Bilateral;   WISDOM TOOTH EXTRACTION        she was in the 9th grade        Social History         Socioeconomic History   Marital status: Divorced      Spouse name: Not on file   Number of children: 5   Years of education: Not on file   Highest education level: Not on file  Occupational History   Not on file  Tobacco Use   Smoking status: Every Day      Current packs/day: 0.50      Average packs/day: 0.5 packs/day for 14.0 years (7.0 ttl pk-yrs)      Types:  Cigarettes   Smokeless tobacco: Never  Substance and Sexual Activity   Alcohol use: No   Drug use: No   Sexual activity: Not Currently      Birth control/protection: None, Abstinence  Other Topics Concern   Not on file  Social History Narrative   Not on file    Social Determinants of Health        Financial Resource Strain: Not on file  Food Insecurity: Not on file  Transportation Needs: Not on file  Physical Activity: Not on file  Stress: Not on file  Social Connections: Unknown (07/01/2021)    Received from [facility], [facility]    Social Network     Social Network: Not on file         Family History  Problem Relation Age of Onset   Hyperlipidemia Mother     Hypertension Mother     Ovarian cancer Mother 16   Heart disease Father     Kidney disease Father          kidney stones   Heart attack Father  Cancer Paternal Grandmother     Kidney cancer Maternal Aunt 41   Cancer Maternal Grandmother 105        female organ cancer   Colon cancer Other          MGMs sister dx in her 106s        Allergies  No Known Allergies         Current Outpatient Medications  Medication Sig Dispense Refill   amoxicillin (AMOXIL) 500 MG capsule TAKE THREE CAPSULES BY MOUTH TWICE DAILY       benzonatate  (TESSALON ) 100 MG capsule Take 1 capsule (100 mg total) by mouth 3 (three) times daily as needed for cough. (Patient not taking: Reported on 04/13/2018) 30 capsule 0   ibuprofen  (ADVIL ,MOTRIN ) 200 MG tablet Take 800 mg by mouth every 6 (six) hours as needed.       oseltamivir (TAMIFLU) 75 MG capsule Take 75 mg by mouth 2 (two) times daily.       traMADol  (ULTRAM ) 50 MG tablet Take 1 tablet (50 mg total) by mouth every 6 (six) hours as needed. 12 tablet 0      No current facility-administered medications for this visit.      Imaging Results (Last 48 hours)  No results found.     Review of Systems:   A ROS was performed including pertinent positives and negatives as  documented in the HPI.   Physical Exam :   Constitutional: NAD and appears stated age Neurological: Alert and oriented Psych: Appropriate affect and cooperative unknown if currently breastfeeding.    Comprehensive Musculoskeletal Exam:     Full active range of motion of bilateral shoulders to 160 degrees forward flexion, 60 degrees external rotation, and internal rotation to L4.  Tenderness over the anterior glenohumeral joint and AC joint.  Positive O'Brien and cross-body adduction.  Negative empty can test.   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     Assessment:   43 y.o. female with right shoulder pain.  I did describe that her MRI today and exam are consistent with [facility] joint arthritis.  At this point I would recommend an ultrasound-guided injection into this area to hopefully get her some relief.  This time she is hoping to avoid any type of surgery.  I will plan to see her back as needed   Plan :     -Right AC joint injection for added after verbal consent obtained    Procedure Note  Patient: [name]             Date of Birth: 19-Dec-1980           MRN: [id number]             Visit Date: 02/26/2023  Procedures: Visit Diagnoses: No diagnosis found.  Large Joint Inj on 02/26/2023 12:56 PM Indications: pain Details: 22 G 1.5 in needle, ultrasound-guided anterior approach  Arthrogram: No  Medications: 4 mL lidocaine  1 %; 80 mg triamcinolone  acetonide 40 MG/ML Outcome: tolerated well, no immediate complications Procedure, treatment alternatives, risks and benefits explained, specific risks discussed. Consent was given by the patient. Immediately prior to procedure a time out was called to verify the correct patient, procedure, equipment, support staff and site/side marked as required. Patient was prepped and draped in the usual sterile  fashion.  I personally saw and evaluated the patient, and participated in the management and treatment plan.

## 2023-12-20 ENCOUNTER — Encounter: Payer: Self-pay | Admitting: Radiology

## 2024-03-15 ENCOUNTER — Ambulatory Visit (HOSPITAL_BASED_OUTPATIENT_CLINIC_OR_DEPARTMENT_OTHER): Admitting: Orthopaedic Surgery

## 2024-03-29 ENCOUNTER — Ambulatory Visit (HOSPITAL_BASED_OUTPATIENT_CLINIC_OR_DEPARTMENT_OTHER): Admitting: Orthopaedic Surgery
